# Patient Record
Sex: Female | Born: 1970 | Race: White | Hispanic: No | Marital: Married | Smoking: Former smoker
Health system: Southern US, Community
[De-identification: ages and names within clinical notes are randomized; demographics above are authoritative.]

## PROBLEM LIST (undated history)

## (undated) DIAGNOSIS — K5100 Ulcerative (chronic) pancolitis without complications: Secondary | ICD-10-CM

## (undated) DIAGNOSIS — N200 Calculus of kidney: Secondary | ICD-10-CM

## (undated) DIAGNOSIS — I10 Essential (primary) hypertension: Secondary | ICD-10-CM

## (undated) DIAGNOSIS — N201 Calculus of ureter: Secondary | ICD-10-CM

## (undated) DIAGNOSIS — B029 Zoster without complications: Secondary | ICD-10-CM

## (undated) DIAGNOSIS — L309 Dermatitis, unspecified: Secondary | ICD-10-CM

## (undated) DIAGNOSIS — Z85828 Personal history of other malignant neoplasm of skin: Secondary | ICD-10-CM

## (undated) DIAGNOSIS — Z9889 Other specified postprocedural states: Secondary | ICD-10-CM

## (undated) DIAGNOSIS — N281 Cyst of kidney, acquired: Secondary | ICD-10-CM

## (undated) DIAGNOSIS — R7989 Other specified abnormal findings of blood chemistry: Secondary | ICD-10-CM

## (undated) DIAGNOSIS — F419 Anxiety disorder, unspecified: Secondary | ICD-10-CM

## (undated) DIAGNOSIS — E876 Hypokalemia: Secondary | ICD-10-CM

## (undated) DIAGNOSIS — A0472 Enterocolitis due to Clostridium difficile, not specified as recurrent: Principal | ICD-10-CM

## (undated) DIAGNOSIS — C4491 Basal cell carcinoma of skin, unspecified: Secondary | ICD-10-CM

## (undated) DIAGNOSIS — T7840XA Allergy, unspecified, initial encounter: Secondary | ICD-10-CM

## (undated) HISTORY — DX: Other specified abnormal findings of blood chemistry: R79.89

## (undated) HISTORY — DX: Zoster without complications: B02.9

## (undated) HISTORY — DX: Allergy, unspecified, initial encounter: T78.40XA

## (undated) HISTORY — DX: Anxiety disorder, unspecified: F41.9

## (undated) HISTORY — DX: Basal cell carcinoma of skin, unspecified: C44.91

## (undated) HISTORY — DX: Ulcerative (chronic) pancolitis without complications: K51.00

## (undated) HISTORY — PX: EXTRACORPOREAL SHOCK WAVE LITHOTRIPSY: SHX1557

## (undated) HISTORY — DX: Hypokalemia: E87.6

## (undated) HISTORY — DX: Enterocolitis due to Clostridium difficile, not specified as recurrent: A04.72

---

## 2001-08-17 DIAGNOSIS — F419 Anxiety disorder, unspecified: Secondary | ICD-10-CM

## 2001-08-17 HISTORY — DX: Anxiety disorder, unspecified: F41.9

## 2006-10-26 ENCOUNTER — Other Ambulatory Visit: Admission: RE | Admit: 2006-10-26 | Discharge: 2006-10-26 | Payer: Self-pay | Admitting: *Deleted

## 2008-08-17 HISTORY — PX: MOHS SURGERY: SUR867

## 2009-04-17 ENCOUNTER — Ambulatory Visit: Payer: Self-pay | Admitting: Otolaryngology

## 2009-04-24 ENCOUNTER — Ambulatory Visit: Payer: Self-pay | Admitting: Otolaryngology

## 2009-04-25 ENCOUNTER — Ambulatory Visit: Payer: Self-pay | Admitting: Otolaryngology

## 2009-09-10 ENCOUNTER — Other Ambulatory Visit: Admission: RE | Admit: 2009-09-10 | Discharge: 2009-09-10 | Payer: Self-pay | Admitting: Family Medicine

## 2010-05-15 ENCOUNTER — Ambulatory Visit (HOSPITAL_COMMUNITY): Admission: RE | Admit: 2010-05-15 | Discharge: 2010-05-15 | Payer: Self-pay | Admitting: Urology

## 2010-07-03 ENCOUNTER — Ambulatory Visit (HOSPITAL_COMMUNITY): Admission: RE | Admit: 2010-07-03 | Discharge: 2010-07-03 | Payer: Self-pay | Admitting: Urology

## 2010-10-28 LAB — PREGNANCY, URINE: Preg Test, Ur: NEGATIVE

## 2010-10-30 LAB — COMPREHENSIVE METABOLIC PANEL
ALT: 32 U/L (ref 0–35)
AST: 31 U/L (ref 0–37)
Albumin: 4.2 g/dL (ref 3.5–5.2)
Alkaline Phosphatase: 78 U/L (ref 39–117)
BUN: 8 mg/dL (ref 6–23)
CO2: 25 mEq/L (ref 19–32)
Calcium: 9 mg/dL (ref 8.4–10.5)
Chloride: 108 mEq/L (ref 96–112)
Creatinine, Ser: 0.81 mg/dL (ref 0.4–1.2)
GFR calc Af Amer: >60 mL/min (ref >60)
GFR calc non Af Amer: >60 mL/min (ref >60)
Glucose, Bld: 100 mg/dL — ABNORMAL HIGH (ref 70–99)
Potassium: 3.5 mEq/L (ref 3.5–5.1)
Sodium: 140 mEq/L (ref 135–145)
Total Bilirubin: 1.5 mg/dL — ABNORMAL HIGH (ref 0.3–1.2)
Total Protein: 7.4 g/dL (ref 6.0–8.3)

## 2010-10-30 LAB — APTT: aPTT: 27 seconds (ref 24–37)

## 2010-10-30 LAB — CBC
HCT: 38.1 % (ref 36.0–46.0)
Hemoglobin: 13.1 g/dL (ref 12.0–15.0)
MCH: 29.6 pg (ref 26.0–34.0)
MCHC: 34.3 g/dL (ref 30.0–36.0)
MCV: 86.3 fL (ref 78.0–100.0)
Platelets: 286 10*3/uL (ref 150–400)
RBC: 4.42 MIL/uL (ref 3.87–5.11)
RDW: 13.4 % (ref 11.5–15.5)
WBC: 7.9 10*3/uL (ref 4.0–10.5)

## 2010-10-30 LAB — PTH, INTACT AND CALCIUM
Calcium, Total (PTH): 9.1 mg/dL (ref 8.4–10.5)
PTH: 59 pg/mL (ref 14.0–72.0)

## 2010-10-30 LAB — URIC ACID: Uric Acid, Serum: 7.6 mg/dL — ABNORMAL HIGH (ref 2.4–7.0)

## 2010-10-30 LAB — PROTIME-INR
INR: 0.94 (ref 0.00–1.49)
Prothrombin Time: 12.8 seconds (ref 11.6–15.2)

## 2010-10-30 LAB — PREGNANCY, URINE: Preg Test, Ur: NEGATIVE

## 2010-11-12 ENCOUNTER — Other Ambulatory Visit: Payer: Self-pay | Admitting: Family Medicine

## 2010-11-12 DIAGNOSIS — Z1231 Encounter for screening mammogram for malignant neoplasm of breast: Secondary | ICD-10-CM

## 2010-11-21 ENCOUNTER — Ambulatory Visit
Admission: RE | Admit: 2010-11-21 | Discharge: 2010-11-21 | Disposition: A | Discharge status: Home / Self Care | Payer: Federal, State, Local not specified - PPO | Source: Ambulatory Visit | Attending: Family Medicine | Admitting: Family Medicine

## 2010-11-21 DIAGNOSIS — Z1231 Encounter for screening mammogram for malignant neoplasm of breast: Secondary | ICD-10-CM

## 2011-06-19 ENCOUNTER — Other Ambulatory Visit: Payer: Self-pay | Admitting: Family Medicine

## 2011-06-19 DIAGNOSIS — Z1231 Encounter for screening mammogram for malignant neoplasm of breast: Secondary | ICD-10-CM

## 2011-07-22 ENCOUNTER — Ambulatory Visit
Admission: RE | Admit: 2011-07-22 | Discharge: 2011-07-22 | Disposition: A | Discharge status: Home / Self Care | Payer: Federal, State, Local not specified - PPO | Source: Ambulatory Visit | Attending: Family Medicine | Admitting: Family Medicine

## 2011-07-22 DIAGNOSIS — Z1231 Encounter for screening mammogram for malignant neoplasm of breast: Secondary | ICD-10-CM

## 2012-08-15 ENCOUNTER — Other Ambulatory Visit: Payer: Self-pay | Admitting: Family Medicine

## 2012-08-15 DIAGNOSIS — Z1231 Encounter for screening mammogram for malignant neoplasm of breast: Secondary | ICD-10-CM

## 2012-09-20 ENCOUNTER — Ambulatory Visit
Admission: RE | Admit: 2012-09-20 | Discharge: 2012-09-20 | Disposition: A | Discharge status: Home / Self Care | Payer: Federal, State, Local not specified - PPO | Source: Ambulatory Visit | Attending: Family Medicine | Admitting: Family Medicine

## 2012-09-20 DIAGNOSIS — Z1231 Encounter for screening mammogram for malignant neoplasm of breast: Secondary | ICD-10-CM

## 2013-04-19 ENCOUNTER — Other Ambulatory Visit (HOSPITAL_COMMUNITY)
Admission: RE | Admit: 2013-04-19 | Discharge: 2013-04-19 | Disposition: A | Discharge status: Home / Self Care | Payer: Federal, State, Local not specified - PPO | Source: Ambulatory Visit | Attending: Family Medicine | Admitting: Family Medicine

## 2013-04-19 ENCOUNTER — Other Ambulatory Visit: Payer: Self-pay

## 2013-04-19 DIAGNOSIS — Z0000 Encounter for general adult medical examination without abnormal findings: Secondary | ICD-10-CM | POA: Insufficient documentation

## 2013-10-31 ENCOUNTER — Other Ambulatory Visit: Payer: Self-pay

## 2013-10-31 DIAGNOSIS — Z1231 Encounter for screening mammogram for malignant neoplasm of breast: Secondary | ICD-10-CM

## 2013-11-17 ENCOUNTER — Ambulatory Visit: Payer: Federal, State, Local not specified - PPO

## 2013-12-05 ENCOUNTER — Encounter (INDEPENDENT_AMBULATORY_CARE_PROVIDER_SITE_OTHER): Payer: Self-pay

## 2013-12-05 ENCOUNTER — Ambulatory Visit
Admission: RE | Admit: 2013-12-05 | Discharge: 2013-12-05 | Disposition: A | Discharge status: Home / Self Care | Payer: Self-pay | Source: Ambulatory Visit

## 2013-12-05 DIAGNOSIS — Z1231 Encounter for screening mammogram for malignant neoplasm of breast: Secondary | ICD-10-CM

## 2013-12-07 ENCOUNTER — Other Ambulatory Visit: Payer: Self-pay | Admitting: Family Medicine

## 2013-12-07 DIAGNOSIS — R928 Other abnormal and inconclusive findings on diagnostic imaging of breast: Secondary | ICD-10-CM

## 2013-12-15 ENCOUNTER — Ambulatory Visit
Admission: RE | Admit: 2013-12-15 | Discharge: 2013-12-15 | Disposition: A | Discharge status: Home / Self Care | Payer: Federal, State, Local not specified - PPO | Source: Ambulatory Visit | Attending: Family Medicine | Admitting: Family Medicine

## 2013-12-15 DIAGNOSIS — R928 Other abnormal and inconclusive findings on diagnostic imaging of breast: Secondary | ICD-10-CM

## 2014-09-21 ENCOUNTER — Other Ambulatory Visit: Payer: Self-pay | Admitting: Urology

## 2014-09-21 ENCOUNTER — Emergency Department (HOSPITAL_COMMUNITY): Payer: Federal, State, Local not specified - PPO

## 2014-09-21 ENCOUNTER — Encounter (HOSPITAL_COMMUNITY): Payer: Self-pay | Admitting: Emergency Medicine

## 2014-09-21 ENCOUNTER — Emergency Department (HOSPITAL_COMMUNITY)
Admission: EM | Admit: 2014-09-21 | Discharge: 2014-09-21 | Disposition: A | Discharge status: Home / Self Care | Payer: Federal, State, Local not specified - PPO | Attending: Emergency Medicine | Admitting: Emergency Medicine

## 2014-09-21 DIAGNOSIS — I10 Essential (primary) hypertension: Secondary | ICD-10-CM | POA: Insufficient documentation

## 2014-09-21 DIAGNOSIS — N201 Calculus of ureter: Secondary | ICD-10-CM | POA: Insufficient documentation

## 2014-09-21 DIAGNOSIS — R319 Hematuria, unspecified: Secondary | ICD-10-CM | POA: Diagnosis not present

## 2014-09-21 DIAGNOSIS — Z3202 Encounter for pregnancy test, result negative: Secondary | ICD-10-CM | POA: Insufficient documentation

## 2014-09-21 DIAGNOSIS — R1031 Right lower quadrant pain: Secondary | ICD-10-CM | POA: Diagnosis present

## 2014-09-21 DIAGNOSIS — N23 Unspecified renal colic: Secondary | ICD-10-CM

## 2014-09-21 HISTORY — DX: Essential (primary) hypertension: I10

## 2014-09-21 LAB — CBC WITH DIFFERENTIAL/PLATELET
Basophils Absolute: 0.1 10*3/uL (ref 0.0–0.1)
Basophils Relative: 0 % (ref 0–1)
Eosinophils Absolute: 0.2 10*3/uL (ref 0.0–0.7)
Eosinophils Relative: 2 % (ref 0–5)
HCT: 38.2 % (ref 36.0–46.0)
Hemoglobin: 13.1 g/dL (ref 12.0–15.0)
Lymphocytes Relative: 18 % (ref 12–46)
Lymphs Abs: 2.5 10*3/uL (ref 0.7–4.0)
MCH: 28.7 pg (ref 26.0–34.0)
MCHC: 34.3 g/dL (ref 30.0–36.0)
MCV: 83.6 fL (ref 78.0–100.0)
Monocytes Absolute: 1.1 10*3/uL — ABNORMAL HIGH (ref 0.1–1.0)
Monocytes Relative: 8 % (ref 3–12)
Neutro Abs: 9.8 10*3/uL — ABNORMAL HIGH (ref 1.7–7.7)
Neutrophils Relative %: 72 % (ref 43–77)
Platelets: 267 10*3/uL (ref 150–400)
RBC: 4.57 MIL/uL (ref 3.87–5.11)
RDW: 12.9 % (ref 11.5–15.5)
WBC: 13.7 10*3/uL — ABNORMAL HIGH (ref 4.0–10.5)

## 2014-09-21 LAB — URINALYSIS, ROUTINE W REFLEX MICROSCOPIC
Bilirubin Urine: NEGATIVE
Glucose, UA: NEGATIVE mg/dL
Ketones, ur: NEGATIVE mg/dL
Nitrite: NEGATIVE
Protein, ur: 30 mg/dL — AB
Specific Gravity, Urine: 1.018 (ref 1.005–1.030)
Urobilinogen, UA: 0.2 mg/dL (ref 0.0–1.0)
pH: 6 (ref 5.0–8.0)

## 2014-09-21 LAB — BASIC METABOLIC PANEL
Anion gap: 8 (ref 5–15)
BUN: 16 mg/dL (ref 6–23)
CO2: 25 mmol/L (ref 19–32)
Calcium: 9.1 mg/dL (ref 8.4–10.5)
Chloride: 104 mmol/L (ref 96–112)
Creatinine, Ser: 1.03 mg/dL (ref 0.50–1.10)
GFR calc Af Amer: 75 mL/min — ABNORMAL LOW (ref >90)
GFR calc non Af Amer: 65 mL/min — ABNORMAL LOW (ref >90)
Glucose, Bld: 140 mg/dL — ABNORMAL HIGH (ref 70–99)
Potassium: 3.3 mmol/L — ABNORMAL LOW (ref 3.5–5.1)
Sodium: 137 mmol/L (ref 135–145)

## 2014-09-21 LAB — URINE MICROSCOPIC-ADD ON

## 2014-09-21 LAB — POC URINE PREG, ED: Preg Test, Ur: NEGATIVE

## 2014-09-21 MED ORDER — TAMSULOSIN HCL 0.4 MG PO CAPS: 0.4000 mg | ORAL_CAPSULE | Freq: Every day | ORAL | Status: DC

## 2014-09-21 MED ORDER — SULFAMETHOXAZOLE-TRIMETHOPRIM 800-160 MG PO TABS: 1.0000 | ORAL_TABLET | Freq: Two times a day (BID) | ORAL | Status: DC

## 2014-09-21 MED ORDER — OXYCODONE-ACETAMINOPHEN 5-325 MG PO TABS: 2.0000 | ORAL_TABLET | ORAL | Status: DC | PRN

## 2014-09-21 MED ORDER — ONDANSETRON 4 MG PO TBDP: 8.0000 mg | ORAL_TABLET | Freq: Once | ORAL | Status: DC

## 2014-09-21 MED ORDER — ONDANSETRON HCL 4 MG/2ML IJ SOLN
4.0000 mg | Freq: Once | INTRAMUSCULAR | Status: AC
  Administered 2014-09-21: 4 mg via INTRAVENOUS
  Filled 2014-09-21: qty 2

## 2014-09-21 MED ORDER — MORPHINE SULFATE 4 MG/ML IJ SOLN
4.0000 mg | Freq: Once | INTRAMUSCULAR | Status: AC
Start: 2014-09-21 — End: 2014-09-21
  Administered 2014-09-21: 4 mg via INTRAVENOUS
  Filled 2014-09-21: qty 1

## 2014-09-21 NOTE — ED Notes (Signed)
Pt. reports RLQ pain with nausea , vomitting , diarrhea and chills.

## 2014-09-21 NOTE — ED Notes (Signed)
Patient transported to CT 

## 2014-09-21 NOTE — ED Provider Notes (Signed)
CSN: [id number]     Arrival date & time 09/21/14  0137 History  This chart was scribed for Sharyon Cable, MD by Molli Posey, ED Scribe. This patient was seen in room D36C/D36C and the patient's care was started 2:06 AM.     Chief Complaint  Patient presents with  . Emesis  . Abdominal Pain   Patient is a 44 y.o. female presenting with abdominal pain. The history is provided by the patient. No language interpreter was used.  Abdominal Pain Pain location:  RLQ Pain quality: cramping   Pain radiates to:  Does not radiate Pain severity:  Unable to specify Onset quality:  Sudden Duration:  8 days Timing:  Constant Progression:  Unable to specify Relieved by:  Nothing Associated symptoms: chills, diarrhea, nausea and vomiting   Associated symptoms: no cough, no dysuria, no fever, no vaginal bleeding and no vaginal discharge    HPI Comments: Elizabeth Huerta is a 44 y.o. female with a history of HTN who presents to the Emergency Department complaining of constant RLQ pain that started at [facility] last night. She describes her abdominal pain as cramping and sharp. Pt reports associated 2 episodes of diarrhea that started around 9PM last night and vomiting around 12:30AM this morning. She states that she has chills as well. Pt reports no similar prior past episodes. She states that she has been taking Naproxen twice a day for the last 10 days for her elbow. She reports no alleviating or modifying factors at this time. Pt reports no known sick contacts. She denies a history of any recent surgeries. She denies blood in her stool, fever, dysuria, vaginal bleeding or discharge and cough. Pt reports NKDA.   Past Medical History  Diagnosis Date  . Hypertension    History reviewed. No pertinent past surgical history. No family history on file. History  Substance Use Topics  . Smoking status: Never Smoker   . Smokeless tobacco: Not on file  . Alcohol Use: Yes   OB History    No data available      Review of Systems  Constitutional: Positive for chills. Negative for fever.  Respiratory: Negative for cough.   Gastrointestinal: Positive for nausea, vomiting, abdominal pain and diarrhea.  Genitourinary: Negative for dysuria, vaginal bleeding and vaginal discharge.  All other systems reviewed and are negative.   Allergies  Review of patient's allergies indicates no known allergies.  Home Medications   Prior to Admission medications   Not on File   BP 113/71 mmHg  Pulse 67  Temp(Src) 98.1 F (36.7 C) (Oral)  Resp 14  Ht 5\' 4"  (1.626 m)  Wt 190 lb (86.183 kg)  BMI 32.60 kg/m2  SpO2 98%  LMP 09/07/2014   Physical Exam CONSTITUTIONAL: Well developed/well nourished HEAD: Normocephalic/atraumatic EYES: EOMI/PERRL ENMT: Mucous membranes moist NECK: supple no meningeal signs SPINE/BACK:entire spine nontender CV: S1/S2 noted, no murmurs/rubs/gallops noted LUNGS: Lungs are clear to auscultation bilaterally, no apparent distress ABDOMEN: soft, nontender, no rebound or guarding, bowel sounds noted throughout abdomen GU:no cva tenderness NEURO: Pt is awake/alert/appropriate, moves all extremitiesx4.  No facial droop.   EXTREMITIES: pulses normal/equal, full ROM SKIN: warm, color normal PSYCH: no abnormalities of mood noted, alert and oriented to situation  ED Course  Procedures   DIAGNOSTIC STUDIES: Oxygen Saturation is 98% on RA, normal by my interpretation.    COORDINATION OF CARE: 2:12 AM Discussed treatment plan with pt at bedside and pt agreed to plan.  4:08 AM On repeat  exam pt does have mild RLQ tenderness.  She has hematuria - she reports distant h/o nephrolithiasis that required lithotripsy.  She was unaware of hematuria.  This could represent ureterolithiasis.  Ct imaging ordered at patient request 5:21 AM CT shows large ureteral stone Pt is afebrile Her pain is controlled She is not vomiting Urine culture ordered Will start percocet, bactrim and  flomax She will call her urologist today We discussed strict return precautions BP 116/67 mmHg  Pulse 68  Temp(Src) 98.1 F (36.7 C) (Oral)  Resp 14  Ht 5\' 4"  (1.626 m)  Wt 190 lb (86.183 kg)  BMI 32.60 kg/m2  SpO2 95%  LMP 09/07/2014  Labs Review Labs Reviewed  URINALYSIS, ROUTINE W REFLEX MICROSCOPIC - Abnormal; Notable for the following:    APPearance CLOUDY (*)    Hgb urine dipstick LARGE (*)    Protein, ur 30 (*)    Leukocytes, UA MODERATE (*)    All other components within normal limits  BASIC METABOLIC PANEL - Abnormal; Notable for the following:    Potassium 3.3 (*)    Glucose, Bld 140 (*)    GFR calc non Af Amer 65 (*)    GFR calc Af Amer 75 (*)    All other components within normal limits  CBC WITH DIFFERENTIAL/PLATELET - Abnormal; Notable for the following:    WBC 13.7 (*)    Neutro Abs 9.8 (*)    Monocytes Absolute 1.1 (*)    All other components within normal limits  URINE MICROSCOPIC-ADD ON - Abnormal; Notable for the following:    Squamous Epithelial / LPF FEW (*)    Bacteria, UA FEW (*)    All other components within normal limits  POC URINE PREG, ED    Imaging Review Ct Abdomen Pelvis Wo Contrast  09/21/2014   CLINICAL DATA:  Hematuria. Right lower quadrant abdominal pain. Nausea and vomiting.  EXAM: CT ABDOMEN AND PELVIS WITHOUT CONTRAST  TECHNIQUE: Multidetector CT imaging of the abdomen and pelvis was performed following the standard protocol without IV contrast.  COMPARISON:  CT 05/15/2010  FINDINGS: The included lung bases are clear, mild dependent atelectasis.  There is an obstructing 13 x 10 mm stone in the right mid ureter with resultant marked hydroureteronephrosis. There is moderate perinephric and periureteric stranding. Ureter distal to this is decompressed. No additional nonobstructing right stones. There is a right renal cyst in the interpolar region. Punctate nonobstructing stone in the upper left kidney, no left hydroureteronephrosis. Left  ureter is decompressed. Urinary bladder is minimally distended without stone.  Diminished hepatic steatosis from prior. Gallbladder is physiologically distended. The unenhanced spleen, pancreas, and adrenal glands are normal. There are no dilated or thickened bowel loops. The appendix is normal. No free air, free fluid, or intra-abdominal fluid collection. The abdominal aorta is normal in caliber. Small retroperitoneal soft tissue densities likely small lymph nodes and reactive.  Within the pelvis the uterus is normal for age. There is a 2.2 cm left ovarian cyst. No significant pelvic free fluid. No pelvic adenopathy.  No acute osseous abnormality.  IMPRESSION: 1. Obstructing 13 x 10 mm stone in the right mid ureter with moderate to marked resultant hydroureteronephrosis and perinephric stranding. 2. Punctate nonobstructing stone in the left kidney.   Electronically Signed   By: Jeb Levering M.D.   On: 09/21/2014 04:41    Medications  ondansetron (ZOFRAN) injection 4 mg (4 mg Intravenous Given 09/21/14 0233)  morphine 4 MG/ML injection 4 mg (4 mg Intravenous  Given 09/21/14 0235)     MDM   Final diagnoses:  Hematuria  Right ureteral stone  Ureteral colic    Nursing notes including past medical history and social history reviewed and considered in documentation Labs/vital reviewed myself and considered during evaluation  I personally performed the services described in this documentation, which was scribed in my presence. The recorded information has been reviewed and is accurate.        Sharyon Cable, MD 09/21/14 Delrae Rend

## 2014-09-22 LAB — URINE CULTURE: Colony Count: 15000

## 2014-09-25 ENCOUNTER — Encounter (HOSPITAL_BASED_OUTPATIENT_CLINIC_OR_DEPARTMENT_OTHER): Payer: Self-pay | Admitting: *Deleted

## 2014-09-25 NOTE — Progress Notes (Signed)
NPO AFTER MN. ARRIVE AT 0600. NEEDS EKG. CURRENT LAB RESULTS IN CHART AND EPIC. MAY TAKE PAIN / NAUSEA AM DOS W/ SIPS OF WATER.

## 2014-09-28 ENCOUNTER — Ambulatory Visit (HOSPITAL_BASED_OUTPATIENT_CLINIC_OR_DEPARTMENT_OTHER): Payer: Federal, State, Local not specified - PPO | Admitting: Anesthesiology

## 2014-09-28 ENCOUNTER — Encounter (HOSPITAL_BASED_OUTPATIENT_CLINIC_OR_DEPARTMENT_OTHER)
Admission: RE | Disposition: A | Discharge status: Home / Self Care | Payer: Self-pay | Source: Ambulatory Visit | Attending: Urology

## 2014-09-28 ENCOUNTER — Encounter (HOSPITAL_BASED_OUTPATIENT_CLINIC_OR_DEPARTMENT_OTHER): Payer: Self-pay | Admitting: *Deleted

## 2014-09-28 ENCOUNTER — Ambulatory Visit (HOSPITAL_BASED_OUTPATIENT_CLINIC_OR_DEPARTMENT_OTHER)
Admission: RE | Admit: 2014-09-28 | Discharge: 2014-09-28 | Disposition: A | Discharge status: Home / Self Care | Payer: Federal, State, Local not specified - PPO | Source: Ambulatory Visit | Attending: Urology | Admitting: Urology

## 2014-09-28 DIAGNOSIS — Z85828 Personal history of other malignant neoplasm of skin: Secondary | ICD-10-CM | POA: Diagnosis not present

## 2014-09-28 DIAGNOSIS — N132 Hydronephrosis with renal and ureteral calculous obstruction: Secondary | ICD-10-CM | POA: Diagnosis not present

## 2014-09-28 DIAGNOSIS — N202 Calculus of kidney with calculus of ureter: Secondary | ICD-10-CM | POA: Diagnosis present

## 2014-09-28 DIAGNOSIS — I10 Essential (primary) hypertension: Secondary | ICD-10-CM | POA: Insufficient documentation

## 2014-09-28 HISTORY — PX: CYSTOSCOPY/RETROGRADE/URETEROSCOPY: SHX5316

## 2014-09-28 HISTORY — DX: Other specified postprocedural states: Z98.890

## 2014-09-28 HISTORY — DX: Calculus of kidney: N20.0

## 2014-09-28 HISTORY — DX: Cyst of kidney, acquired: N28.1

## 2014-09-28 HISTORY — DX: Other specified postprocedural states: Z85.828

## 2014-09-28 HISTORY — DX: Dermatitis, unspecified: L30.9

## 2014-09-28 HISTORY — PX: HOLMIUM LASER APPLICATION: SHX5852

## 2014-09-28 HISTORY — DX: Calculus of ureter: N20.1

## 2014-09-28 LAB — POCT PREGNANCY, URINE: Preg Test, Ur: NEGATIVE

## 2014-09-28 SURGERY — CYSTOSCOPY/RETROGRADE/URETEROSCOPY
Anesthesia: General | Site: Ureter | Laterality: Right

## 2014-09-28 MED ORDER — MEPERIDINE HCL 25 MG/ML IJ SOLN
6.2500 mg | INTRAMUSCULAR | Status: DC | PRN
  Filled 2014-09-28: qty 1

## 2014-09-28 MED ORDER — LACTATED RINGERS IV SOLN
INTRAVENOUS | Status: DC
  Administered 2014-09-28: 06:00:00 via INTRAVENOUS
  Filled 2014-09-28: qty 1000

## 2014-09-28 MED ORDER — MIDAZOLAM HCL 5 MG/5ML IJ SOLN
INTRAMUSCULAR | Status: DC | PRN
  Administered 2014-09-28: 2 mg via INTRAVENOUS

## 2014-09-28 MED ORDER — MIDAZOLAM HCL 2 MG/2ML IJ SOLN
INTRAMUSCULAR | Status: AC
  Filled 2014-09-28: qty 2

## 2014-09-28 MED ORDER — ACETAMINOPHEN 10 MG/ML IV SOLN
INTRAVENOUS | Status: DC | PRN
  Administered 2014-09-28: 1000 mg via INTRAVENOUS

## 2014-09-28 MED ORDER — KETOROLAC TROMETHAMINE 30 MG/ML IJ SOLN
INTRAMUSCULAR | Status: DC | PRN
  Administered 2014-09-28: 30 mg via INTRAVENOUS

## 2014-09-28 MED ORDER — ONDANSETRON HCL 4 MG/2ML IJ SOLN
INTRAMUSCULAR | Status: DC | PRN
  Administered 2014-09-28: 4 mg via INTRAVENOUS

## 2014-09-28 MED ORDER — PROMETHAZINE HCL 25 MG/ML IJ SOLN
6.2500 mg | INTRAMUSCULAR | Status: DC | PRN
  Filled 2014-09-28: qty 1

## 2014-09-28 MED ORDER — DEXAMETHASONE SODIUM PHOSPHATE 4 MG/ML IJ SOLN
INTRAMUSCULAR | Status: DC | PRN
  Administered 2014-09-28: 10 mg via INTRAVENOUS

## 2014-09-28 MED ORDER — OXYCODONE-ACETAMINOPHEN 5-325 MG PO TABS: 1.0000 | ORAL_TABLET | Freq: Four times a day (QID) | ORAL | Status: DC | PRN

## 2014-09-28 MED ORDER — FENTANYL CITRATE 0.05 MG/ML IJ SOLN
INTRAMUSCULAR | Status: DC | PRN
  Administered 2014-09-28 (×2): 50 ug via INTRAVENOUS

## 2014-09-28 MED ORDER — FENTANYL CITRATE 0.05 MG/ML IJ SOLN
25.0000 ug | INTRAMUSCULAR | Status: DC | PRN
  Filled 2014-09-28: qty 1

## 2014-09-28 MED ORDER — LACTATED RINGERS IV SOLN
INTRAVENOUS | Status: DC
  Filled 2014-09-28: qty 1000

## 2014-09-28 MED ORDER — PROPOFOL 10 MG/ML IV BOLUS
INTRAVENOUS | Status: DC | PRN
  Administered 2014-09-28: 200 mg via INTRAVENOUS

## 2014-09-28 MED ORDER — FENTANYL CITRATE 0.05 MG/ML IJ SOLN
INTRAMUSCULAR | Status: AC
  Filled 2014-09-28: qty 4

## 2014-09-28 MED ORDER — GENTAMICIN SULFATE 40 MG/ML IJ SOLN
130.6500 mg | INTRAVENOUS | Status: DC | PRN
  Administered 2014-09-28: 340 mg via INTRAVENOUS

## 2014-09-28 MED ORDER — SODIUM CHLORIDE 0.9 % IR SOLN
Status: DC | PRN
  Administered 2014-09-28: 4000 mL

## 2014-09-28 MED ORDER — GENTAMICIN IN SALINE 1.6-0.9 MG/ML-% IV SOLN
80.0000 mg | INTRAVENOUS | Status: DC
  Filled 2014-09-28: qty 50

## 2014-09-28 MED ORDER — SULFAMETHOXAZOLE-TRIMETHOPRIM 800-160 MG PO TABS
1.0000 | ORAL_TABLET | Freq: Two times a day (BID) | ORAL | Status: DC
Start: 2014-09-28 — End: 2015-06-20

## 2014-09-28 MED ORDER — IOHEXOL 350 MG/ML SOLN
INTRAVENOUS | Status: DC | PRN
Start: 2014-09-28 — End: 2014-09-28
  Administered 2014-09-28: 17 mL

## 2014-09-28 MED ORDER — STERILE WATER FOR IRRIGATION IR SOLN
Status: DC | PRN
  Administered 2014-09-28: 500 mL

## 2014-09-28 MED ORDER — SENNOSIDES-DOCUSATE SODIUM 8.6-50 MG PO TABS: 1.0000 | ORAL_TABLET | Freq: Two times a day (BID) | ORAL | Status: DC

## 2014-09-28 MED ORDER — LIDOCAINE HCL (CARDIAC) 20 MG/ML IV SOLN
INTRAVENOUS | Status: DC | PRN
  Administered 2014-09-28: 80 mg via INTRAVENOUS

## 2014-09-28 SURGICAL SUPPLY — 48 items
BAG DRAIN URO-CYSTO SKYTR STRL (DRAIN) ×2 IMPLANT
BAG DRN UROCATH (DRAIN) ×1
BAG URO CATCHER STRL LF (DRAPE) ×2 IMPLANT
BASKET LASER NITINOL 1.9FR (BASKET) ×2 IMPLANT
BASKET STNLS GEMINI 4WIRE 3FR (BASKET) IMPLANT
BASKET ZERO TIP NITINOL 2.4FR (BASKET) IMPLANT
BSKT STON RTRVL 120 1.9FR (BASKET) ×1
BSKT STON RTRVL GEM 120X11 3FR (BASKET)
BSKT STON RTRVL ZERO TP 2.4FR (BASKET)
CANISTER SUCT LVC 12 LTR MEDI- (MISCELLANEOUS) ×2 IMPLANT
CATH INTERMIT  6FR 70CM (CATHETERS) ×2 IMPLANT
CATH URET 5FR 28IN CONE TIP (BALLOONS)
CATH URET 5FR 28IN OPEN ENDED (CATHETERS) ×1 IMPLANT
CATH URET 5FR 70CM CONE TIP (BALLOONS) IMPLANT
CLOTH BEACON ORANGE TIMEOUT ST (SAFETY) ×2 IMPLANT
DRAPE CAMERA CLOSED 9X96 (DRAPES) ×2 IMPLANT
ELECT REM PT RETURN 9FT ADLT (ELECTROSURGICAL)
ELECTRODE REM PT RTRN 9FT ADLT (ELECTROSURGICAL) IMPLANT
FIBER LASER FLEXIVA 200 (UROLOGICAL SUPPLIES) IMPLANT
FIBER LASER FLEXIVA 365 (UROLOGICAL SUPPLIES) IMPLANT
FIBER LASER TRAC TIP (UROLOGICAL SUPPLIES) ×1 IMPLANT
GLOVE BIO SURGEON STRL SZ7.5 (GLOVE) ×1 IMPLANT
GLOVE ECLIPSE 6.5 STRL STRAW (GLOVE) ×2 IMPLANT
GLOVE OPTIFIT SS 6.5 STRL BRWN (GLOVE) ×1 IMPLANT
GLOVE SURG SS PI 7.5 STRL IVOR (GLOVE) ×1 IMPLANT
GOWN PREVENTION PLUS LG XLONG (DISPOSABLE) ×1 IMPLANT
GOWN PREVENTION PLUS XLARGE (GOWN DISPOSABLE) ×1 IMPLANT
GOWN STRL NON-REIN LRG LVL3 (GOWN DISPOSABLE) ×2 IMPLANT
GOWN STRL REIN XL XLG (GOWN DISPOSABLE) ×1 IMPLANT
GOWN STRL REUS W/ TWL LRG LVL3 (GOWN DISPOSABLE) IMPLANT
GOWN STRL REUS W/ TWL XL LVL3 (GOWN DISPOSABLE) IMPLANT
GOWN STRL REUS W/TWL LRG LVL3 (GOWN DISPOSABLE) ×2
GOWN STRL REUS W/TWL XL LVL3 (GOWN DISPOSABLE) ×2
GUIDEWIRE 0.038 PTFE COATED (WIRE) IMPLANT
GUIDEWIRE ANG ZIPWIRE 038X150 (WIRE) ×2 IMPLANT
GUIDEWIRE STR DUAL SENSOR (WIRE) ×2 IMPLANT
IV NS 1000ML (IV SOLUTION) ×2
IV NS 1000ML BAXH (IV SOLUTION) IMPLANT
IV NS IRRIG 3000ML ARTHROMATIC (IV SOLUTION) ×4 IMPLANT
KIT BALLIN UROMAX 15FX10 (LABEL) IMPLANT
KIT BALLN UROMAX 15FX4 (MISCELLANEOUS) IMPLANT
KIT BALLN UROMAX 26 75X4 (MISCELLANEOUS)
PACK CYSTO (CUSTOM PROCEDURE TRAY) ×2 IMPLANT
SET HIGH PRES BAL DIL (LABEL)
STENT POLARIS 5FRX24 (STENTS) ×1 IMPLANT
SYRINGE 10CC LL (SYRINGE) ×2 IMPLANT
SYRINGE IRR TOOMEY STRL 70CC (SYRINGE) IMPLANT
TUBE FEEDING 8FR 16IN STR KANG (MISCELLANEOUS) ×2 IMPLANT

## 2014-09-28 NOTE — Anesthesia Procedure Notes (Signed)
Procedure Name: LMA Insertion Date/Time: 09/28/2014 7:59 AM Performed by: Mechele Claude Pre-anesthesia Checklist: Patient identified, Emergency Drugs available, Suction available and Patient being monitored Patient Re-evaluated:Patient Re-evaluated prior to inductionOxygen Delivery Method: Circle System Utilized Preoxygenation: Pre-oxygenation with 100% oxygen Intubation Type: IV induction Ventilation: Mask ventilation without difficulty LMA: LMA inserted LMA Size: 4.0 Number of attempts: 1 Airway Equipment and Method: bite block Placement Confirmation: positive ETCO2 Tube secured with: Tape Dental Injury: Teeth and Oropharynx as per pre-operative assessment

## 2014-09-28 NOTE — Anesthesia Postprocedure Evaluation (Signed)
  Anesthesia Post-op Note  Patient: Elizabeth Huerta  Procedure(s) Performed: Procedure(s) (LRB): CYSTOSCOPY RIGHT RETROGRADE RIGHT URETEROSCOPY (Right) HOLMIUM LASER OF STONES  (Right)  Patient Location: PACU  Anesthesia Type: General  Level of Consciousness: awake and alert   Airway and Oxygen Therapy: Patient Spontanous Breathing  Post-op Pain: mild  Post-op Assessment: Post-op Vital signs reviewed, Patient's Cardiovascular Status Stable, Respiratory Function Stable, Patent Airway and No signs of Nausea or vomiting  Last Vitals:  Filed Vitals:   09/28/14 1000  BP: 125/79  Pulse: 70  Temp: 36.4 C  Resp: 16    Post-op Vital Signs: stable   Complications: No apparent anesthesia complications

## 2014-09-28 NOTE — Anesthesia Preprocedure Evaluation (Addendum)
Anesthesia Evaluation  Patient identified by MRN, date of birth, ID band Patient awake    Reviewed: Allergy & Precautions, NPO status , Patient's Chart, lab work & pertinent test results  Airway Mallampati: II  TM Distance: >3 FB Neck ROM: Full    Dental no notable dental hx. (+)    Pulmonary neg pulmonary ROS,  breath sounds clear to auscultation  Pulmonary exam normal       Cardiovascular hypertension, Pt. on medications Rhythm:Regular Rate:Normal     Neuro/Psych negative neurological ROS  negative psych ROS   GI/Hepatic negative GI ROS, Neg liver ROS,   Endo/Other  negative endocrine ROS  Renal/GU negative Renal ROS  negative genitourinary   Musculoskeletal negative musculoskeletal ROS (+)   Abdominal   Peds negative pediatric ROS (+)  Hematology negative hematology ROS (+)   Anesthesia Other Findings   Reproductive/Obstetrics negative OB ROS                            Anesthesia Physical Anesthesia Plan  ASA: II  Anesthesia Plan: General   Post-op Pain Management:    Induction: Intravenous  Airway Management Planned: LMA  Additional Equipment:   Intra-op Plan:   Post-operative Plan: Extubation in OR  Informed Consent: I have reviewed the patients History and Physical, chart, labs and discussed the procedure including the risks, benefits and alternatives for the proposed anesthesia with the patient or authorized representative who has indicated his/her understanding and acceptance.   Dental advisory given  Plan Discussed with: CRNA  Anesthesia Plan Comments:         Anesthesia Quick Evaluation

## 2014-09-28 NOTE — Discharge Instructions (Signed)
1 - You may have urinary urgency (bladder spasms) and bloody urine on / off with stent in place. This is normal.  2 - Call MD or go to ER for fever >102, severe pain / nausea / vomiting not relieved by medications, or acute change in medical status  Post Anesthesia Home Care Instructions  Activity: Get plenty of rest for the remainder of the day. A responsible adult should stay with you for 24 hours following the procedure.  For the next 24 hours, DO NOT: -Drive a car -Paediatric nurse -Drink alcoholic beverages -Take any medication unless instructed by your physician -Make any legal decisions or sign important papers.  Meals: Start with liquid foods such as gelatin or soup. Progress to regular foods as tolerated. Avoid greasy, spicy, heavy foods. If nausea and/or vomiting occur, drink only clear liquids until the nausea and/or vomiting subsides. Call your physician if vomiting continues.  Special Instructions/Symptoms: Your throat may feel dry or sore from the anesthesia or the breathing tube placed in your throat during surgery. If this causes discomfort, gargle with warm salt water. The discomfort should disappear within 24 hours. [facility] [phone] Post Ureteroscopy With or Without Stent Instructions  Definitions:  Ureter: The duct that transports urine from the kidney to the bladder. Stent:   A plastic hollow tube that is placed into the ureter, from the kidney to the                 bladder to prevent the ureter from swelling shut.  GENERAL INSTRUCTIONS:  Despite the fact that no skin incisions were used, the area around the ureter and bladder is raw and irritated. The stent is a foreign body which will further irritate the bladder wall. This irritation is manifested by increased frequency of urination, both day and night, and by an increase in the urge to urinate. In some, the urge to urinate is present almost always. Sometimes the urge is strong enough  that you may not be able to stop yourself from urinating. The only real cure is to remove the stent and then give time for the bladder wall to heal which can't be done until the danger of the ureter swelling shut has passed, which varies.  You may see some blood in your urine while the stent is in place and a few days afterwards. Do not be alarmed, even if the urine was clear for a while. Get off your feet and drink lots of fluids until clearing occurs. If you start to pass clots or don't improve, call us.  DIET: You may return to your normal diet immediately. Because of the raw surface of your bladder, alcohol, spicy foods, acid type foods and drinks with caffeine may cause irritation or frequency and should be used in moderation. To keep your urine flowing freely and to avoid constipation, drink plenty of fluids during the day ( 8-10 glasses ). Tip: Avoid cranberry juice because it is very acidic.  ACTIVITY: Your physical activity doesn't need to be restricted. However, if you are very active, you may see some blood in your urine. We suggest that you reduce your activity under these circumstances until the bleeding has stopped.  BOWELS: It is important to keep your bowels regular during the postoperative period. Straining with bowel movements can cause bleeding. A bowel movement every other day is reasonable. Use a mild laxative if needed, such as Milk of Magnesia 2-3 tablespoons, or 2 Dulcolax tablets. Call if you continue to  have problems. If you have been taking narcotics for pain, before, during or after your surgery, you may be constipated. Take a laxative if necessary.   MEDICATION: You should resume your pre-surgery medications unless told not to. In addition you will often be given an antibiotic to prevent infection. These should be taken as prescribed until the bottles are finished unless you are having an unusual reaction to one of the drugs.  PROBLEMS YOU SHOULD REPORT TO [place]:  Fevers  over 100.5 Fahrenheit.  Heavy bleeding, or clots ( See above notes about blood in urine ).  Inability to urinate.  Drug reactions ( hives, rash, nausea, vomiting, diarrhea ).  Severe burning or pain with urination that is not improving.  FOLLOW-UP: You will need a follow-up appointment to monitor your progress. Call for this appointment at the number listed above. Usually the first appointment will be about three to fourteen days after your surgery.

## 2014-09-28 NOTE — Brief Op Note (Signed)
09/28/2014  8:44 AM  PATIENT:  Elizabeth Huerta  44 y.o. female  PRE-OPERATIVE DIAGNOSIS:  RIGHT URETERAL STONE   POST-OPERATIVE DIAGNOSIS:  RIGHT URETERAL STONE   PROCEDURE:  Procedure(s): CYSTOSCOPY RIGHT RETROGRADE RIGHT URETEROSCOPY (Right) HOLMIUM Huerta OF STONES  (Right)  SURGEON:  Surgeon(s) and Role:    * Alexis Frock, MD - Primary  PHYSICIAN ASSISTANT:   ASSISTANTS: none   ANESTHESIA:   general  EBL:  Total I/O In: 500 [I.V.:500] Out: -   BLOOD ADMINISTERED:none  DRAINS: none   LOCAL MEDICATIONS USED:  NONE  SPECIMEN:  Source of Specimen:  Rt ureteral stone fragments  DISPOSITION OF SPECIMEN:  [facility] for compositional analysis  COUNTS:  YES  TOURNIQUET:  * No tourniquets in log *  DICTATION: .Other Dictation: Dictation Number [id number]  PLAN OF CARE: Discharge to home after PACU  PATIENT DISPOSITION:  PACU - hemodynamically stable.   Delay start of Pharmacological VTE agent (>24hrs) due to surgical blood loss or risk of bleeding: yes

## 2014-09-28 NOTE — Transfer of Care (Signed)
Last Vitals:  Filed Vitals:   09/28/14 0640  BP: 136/70  Pulse: 71  Temp: 37.1 C  Resp: 17    Immediate Anesthesia Transfer of Care Note  Patient: Elizabeth Huerta  Procedure(s) Performed: Procedure(s) (LRB): CYSTOSCOPY RIGHT RETROGRADE RIGHT URETEROSCOPY (Right) HOLMIUM LASER OF STONES  (Right)  Patient Location: PACU  Anesthesia Type: General  Level of Consciousness: awake, alert  and oriented  Airway & Oxygen Therapy: Patient Spontanous Breathing and Patient connected to face mask oxygen  Post-op Assessment: Report given to PACU RN and Post -op Vital signs reviewed and stable  Post vital signs: Reviewed and stable  Complications: No apparent anesthesia complications

## 2014-09-28 NOTE — H&P (Signed)
Elizabeth Huerta is an 44 y.o. female.    Chief Complaint: pre-op right ureteroscopic stone manipulation  HPI:   1 - Recurrent Nephrolithiasis -  Pre 2016 - SWL x 2 09/2014 - Rt 59m mid ureteral stone with mod-severe hydro (1100HU, SSD 9cm) and only punctate non-obstructing left renal stone. UA withtou infectiuos parameters. Cr 1.06.   2 - Medical Stone Disease -  Eval 2012: BMP,PTH,Urate - normal; Composition - 60%CaOx, 40% CAPO4 Eval 2016: BMP normal; Composition - pending; 24 Hr Urines - pending  3 - Gross Hematuria - pt with visible blood on several occasions, at times of acute colic. non-conatrast CT 2016 with stones but no worriseom GU masses.  PMH sig for HTN. NO CV disease. NO blood thinners. [facility]  Today " Elizabeth Huerta" is seen to proceed with right ureteroscopic stone manipulation. No interval fevers.   Past Medical History  Diagnosis Date  . Hypertension   . Right ureteral stone   . Renal cyst, right   . Nephrolithiasis     LEFT  . History of basal cell carcinoma excision     2010-- nasal area  . Hematuria   . Wears glasses   . Eczema     Past Surgical History  Procedure Laterality Date  . Extracorporeal shock wave lithotripsy Bilateral left 05-15-2010/   right 07-03-2010  . Cesarean section  2001  &  2003  . Mohs surgery  2010    nasal area    History reviewed. No pertinent family history. Social History:  reports that she has never smoked. She has never used smokeless tobacco. She reports that she drinks alcohol. She reports that she does not use illicit drugs.  Allergies:  Allergies  Allergen Reactions  . Latex Rash    No prescriptions prior to admission    No results found for this or any previous visit (from the past 48 hour(s)). No results found.  Review of Systems  Constitutional: Negative.  Negative for fever and chills.  HENT: Negative.   Eyes: Negative.   Respiratory: Negative.    Cardiovascular: Negative.   Gastrointestinal: Negative.   Genitourinary: Positive for hematuria and flank pain.  Musculoskeletal: Negative.   Skin: Negative.   Neurological: Negative.   Endo/Heme/Allergies: Negative.   Psychiatric/Behavioral: Negative.     Last menstrual period 09/11/2014. Physical Exam  Constitutional: She appears well-developed.  HENT:  Head: Normocephalic.  Eyes: Pupils are equal, round, and reactive to light.  Neck: Normal range of motion.  Cardiovascular: Normal rate.   Respiratory: Effort normal.  GI: Soft.  Genitourinary:  Mild Rt CVAT  Musculoskeletal: Normal range of motion.  Neurological: She is alert.  Skin: Skin is warm.  Psychiatric: She has a normal mood and affect. Her behavior is normal. Judgment and thought content normal.     Assessment/Plan    1 - Recurrent Nephrolithiasis - present stone in Rt ureter clearly obstructing, unlikely to pass with medical therapy alone. First choice would be ureteroscopy as stone quite dense, second choice SWL. Rec observation of tiny left sided stone.   We rediscussed ureteroscopic stone manipulation with basketing and laser-lithotripsy in detail.  We rediscussed risks including bleeding, infection, damage to kidney / ureter  bladder, rarely loss of kidney. We discussed anesthetic risks and rare but serious surgical complications including DVT, PE, MI, and mortality. We specifically readdressed that in 5-10% of cases a staged approach is required with stenting followed by re-attempt ureteroscopy  if anatomy unfavorable.   The patient voiced understanding and wishes to proceed today as planned.   2 - Medical Stone Disease - suggest complete re-eval this year with stone analysis and 24 hr urienes to see if any modifiable factors exist.  3 - Gross Hematuria - likely from nephrolithiasis. Potential benefit to ureteroscopy would be concomitant cysto and retrogrades to maximally rule out other etiologies.   Sevag Shearn,  Noha Karasik 09/28/2014, 5:54 AM

## 2014-09-29 NOTE — Op Note (Deleted)
Elizabeth Huerta, CRILLY NO.:  [id number]  MEDICAL RECORD NO.:  [id number]  LOCATION:                               FACILITY:  [facility]  PHYSICIAN:  Alexis Frock, MD     DATE OF BIRTH:  April 26, 1971  DATE OF PROCEDURE: DATE OF DISCHARGE:  09/29/2014                              OPERATIVE REPORT   DIAGNOSES: 1. Right large ureteral stone. 2. Hydronephrosis. 3. Flank pain.  PROCEDURE: 1. Right ureteroscopy with laser lithotripsy. 2. Right retrograde pyelogram interpretation. 3. Insertion of right ureteral stent 5 x 24 Polaris, no tether.  ESTIMATED BLOOD LOSS:  Nil.  COMPLICATIONS:  None.  SPECIMENS:  Right ureteral stone fragments for compositional analysis.  FINDINGS: 1. Moderate hydronephrosis and filling defect in distal ureter     consistent with known stone. 2. Large mid ureteral stone with significant periureteral edema. 3. Complete resolution of all stone fragments larger than 1/3rd mm     following laser lithotripsy and removal.  ESTIMATED BLOOD LOSS:  Nil.  Stent, #15 x 24 Polaris.  INDICATIONS:  Elizabeth Huerta is a pleasant 44 year old lady with a history of prior renal colic.  She was found on evaluation of colicky right flank pain and to have a very large right ureteral stone of approximately 13 mm.  There is moderate hydronephrosis above this.  She had no infectious parameters.  Kidney function was normal.  Options were discussed for management including medical therapies versus shockwave lithotripsy versus ureteroscopy.  She  wished to proceed with the latter.  Informed consent was obtained and placed in medical record.  PROCEDURE IN DETAIL:  The patient being Elizabeth Huerta was verified, procedure being right ureteroscopic stone manipulation.  Procedure was carried out.  Time-out was performed.  Intravenous antibiotics were administered.  General LMA anesthesia was introduced.  The patient placed into a low lithotomy position and sterile field  was created by prepping and draping the patient's vagina, introitus, and proximal thighs using iodine x3.  Next,  cystourethroscopy was performed using a 22-French rigid cystoscope with 12-degree offset lens.  Inspection of urinary bladder revealed no diverticula, calcifications, or papular lesions.  The right ureteral orifice was cannulated with a 6-French angled catheter and right retrograde pyelogram was obtained.  Right retrograde pyelogram demonstrated a single right ureter, single system right kidney.  There was a filling defect from the mid ureter consistent with known stone.  There was moderate hydroureteronephrosis and above this a 0.038 ZIPwire was advanced well into the lower pole and set aside as a safety wire.  Next, semi-rigid ureteroscopy was performed in the distal half of the right ureter alongside a separate Sensor working wire.  An 8-French feeding tube in the urinary bladder for pressure release in the mid ureter.  The stone in question was encountered.  There was significant periureteral edema.  The stone appeared to be much too large for simple basketing.  As such, holmium laser energy applied to the stone using settings of 0.4 joules and 20 hertz and the stone was fragmented into approximately 5 smaller pieces. Some of which remained in the mid ureter, however, the majority of them were passed retrograde  up towards the kidney.  The mid ureteral fragments were sequentially grasped with Escape Basket and brought out in their entirety, set aside for compositional analysis.  Inspection of the proximal ureter revealed no  mucosal abnormalities.  It was felt that significant amount of stone burden had been pushed into the kidney and the goal today is a stone free status on the right and it was felt that flexible ureteroscopy was clearly warranted, as such the semi-rigid ureteroscope was exchanged for the 24-cm 12/14 ureteral access sheath at the level the proximal ureter  under continuous fluoroscopic guidance and flexible digital ureteroscopy was performed.  Inspection of the proximal ureter and systematic inspection of the right kidney did not reveal retrograde positioning of previous ureteral stone fragments into the mid pole of the kidney.  These were grasped sequentially and removed in their entirety.  Following these maneuvers, there was complete resolution of all stone fragments larger than 13 mm.  No evidence of renal perforation.  There was excellent hemostasis.  The sheath was removed under continuous ureteroscopic vision.  No mucosal abnormalities were found.  Given the significant edema at the area of prior stone, it was felt that ureteral stenting will clearly be warranted and as such a new 5 x 24 Polaris-type stent was placed using cystoscopic and fluoroscopic guidance.  Good proximal and distal deployment were noted. Procedure was then terminated.  The patient tolerated procedure well with immediate periprocedural complications.  The patient was taken to postanesthesia care unit in stable condition.          ______________________________ Alexis Frock, MD     TM/MEDQ  D:  09/28/2014  T:  09/29/2014  Job:  [id number]

## 2014-09-29 NOTE — Op Note (Deleted)
Elizabeth Huerta, Elizabeth Huerta NO.:  [id number]  MEDICAL RECORD NO.:  [id number]  LOCATION:                               FACILITY:  [facility]  PHYSICIAN:  Alexis Frock, MD     DATE OF BIRTH:  11-21-1970  DATE OF PROCEDURE: 09/28/2014                               OPERATIVE REPORT   DIAGNOSES: 1. Right large ureteral stone. 2. Hydronephrosis. 3. Flank pain.  PROCEDURE: 1. Right ureteroscopy with laser lithotripsy. 2. Right retrograde pyelogram interpretation. 3. Insertion of right ureteral stent 5 x 24 Polaris, no tether.  ESTIMATED BLOOD LOSS:  Nil.  COMPLICATIONS:  None.  SPECIMENS:  Right ureteral stone fragments for compositional analysis.  FINDINGS: 1. Moderate hydronephrosis and filling defect in distal ureter     consistent with known stone. 2. Large mid ureteral stone with significant periureteral edema. 3. Complete resolution of all stone fragments larger than 1/3rd mm     following laser lithotripsy and removal.  ESTIMATED BLOOD LOSS:  Nil.  Stent, #15 x 24 Polaris.  INDICATIONS:  Elizabeth Huerta is a pleasant 44 year old lady with a history of prior renal colic.  She was found on evaluation of colicky right flank pain and to have a very large right ureteral stone of approximately 13 mm.  There is moderate hydronephrosis above this.  She had no infectious parameters.  Kidney function was normal.  Options were discussed for management including medical therapies versus shockwave lithotripsy versus ureteroscopy.  She  wished to proceed with the latter.  Informed consent was obtained and placed in medical record.  PROCEDURE IN DETAIL:  The patient being Elizabeth Huerta was verified, procedure being right ureteroscopic stone manipulation.  Procedure was carried out.  Time-out was performed.  Intravenous antibiotics were administered.  General LMA anesthesia was introduced.  The patient placed into a low lithotomy position and sterile field was created  by prepping and draping the patient's vagina, introitus, and proximal thighs using iodine x3.  Next,  cystourethroscopy was performed using a 22-French rigid cystoscope with 12-degree offset lens.  Inspection of urinary bladder revealed no diverticula, calcifications, or papular lesions.  The right ureteral orifice was cannulated with a 6-French angled catheter and right retrograde pyelogram was obtained.  Right retrograde pyelogram demonstrated a single right ureter, single system right kidney.  There was a filling defect from the mid ureter consistent with known stone.  There was moderate hydroureteronephrosis and above this a 0.038 ZIPwire was advanced well into the lower pole and set aside as a safety wire.  Next, semi-rigid ureteroscopy was performed in the distal half of the right ureter alongside a separate Sensor working wire.  An 8-French feeding tube in the urinary bladder for pressure release in the mid ureter.  The stone in question was encountered.  There was significant periureteral edema.  The stone appeared to be much too large for simple basketing.  As such, holmium laser energy applied to the stone using settings of 0.4 joules and 20 hertz and the stone was fragmented into approximately 5 smaller pieces. Some of which remained in the mid ureter, however, the majority of them were passed retrograde up towards the  kidney.  The mid ureteral fragments were sequentially grasped with Escape Basket and brought out in their entirety, set aside for compositional analysis.  Inspection of the proximal ureter revealed no  mucosal abnormalities.  It was felt that significant amount of stone burden had been pushed into the kidney and the goal today is a stone free status on the right and it was felt that flexible ureteroscopy was clearly warranted, as such the semi-rigid ureteroscope was exchanged for the 24-cm 12/14 ureteral access sheath at the level the proximal ureter under  continuous fluoroscopic guidance and flexible digital ureteroscopy was performed.  Inspection of the proximal ureter and systematic inspection of the right kidney did not reveal retrograde positioning of previous ureteral stone fragments into the mid pole of the kidney.  These were grasped sequentially and removed in their entirety.  Following these maneuvers, there was complete resolution of all stone fragments larger than 13 mm.  No evidence of renal perforation.  There was excellent hemostasis.  The sheath was removed under continuous ureteroscopic vision.  No mucosal abnormalities were found.  Given the significant edema at the area of prior stone, it was felt that ureteral stenting will clearly be warranted and as such a new 5 x 24 Polaris-type stent was placed using cystoscopic and fluoroscopic guidance.  Good proximal and distal deployment were noted. Procedure was then terminated.  The patient tolerated procedure well with immediate periprocedural complications.  The patient was taken to postanesthesia care unit in stable condition.          ______________________________ Alexis Frock, MD     TM/MEDQ  D:  09/28/2014  T:  09/29/2014  Job:  [id number]

## 2014-09-29 NOTE — Op Note (Signed)
Elizabeth Huerta, Elizabeth Huerta NO.:  [id number]  MEDICAL RECORD NO.:  [id number]  LOCATION:                               FACILITY:  [facility]  PHYSICIAN:  Alexis Frock, MD     DATE OF BIRTH:  1971-03-02  DATE OF PROCEDURE: 09/28/2014                               OPERATIVE REPORT   DIAGNOSES: 1. Right large ureteral stone. 2. Hydronephrosis. 3. Flank pain.  PROCEDURE: 1. Right ureteroscopy with laser lithotripsy. 2. Right retrograde pyelogram interpretation. 3. Insertion of right ureteral stent 5 x 24 Polaris, no tether.  ESTIMATED BLOOD LOSS:  Nil.  COMPLICATIONS:  None.  SPECIMENS:  Right ureteral stone fragments for compositional analysis.  FINDINGS: 1. Moderate hydronephrosis and filling defect in distal ureter     consistent with known stone. 2. Large mid ureteral stone with significant periureteral edema. 3. Complete resolution of all stone fragments larger than 1/3rd mm     following laser lithotripsy and removal.  ESTIMATED BLOOD LOSS:  Nil.  Stent, #15 x 24 Polaris.  INDICATIONS:  Elizabeth Huerta is a pleasant 44 year old lady with a history of prior renal colic.  She was found on evaluation of colicky right flank pain and to have a very large right ureteral stone of approximately 13 mm.  There is moderate hydronephrosis above this.  She had no infectious parameters.  Kidney function was normal.  Options were discussed for management including medical therapies versus shockwave lithotripsy versus ureteroscopy.  She  wished to proceed with the latter.  Informed consent was obtained and placed in medical record.  PROCEDURE IN DETAIL:  The patient being Elizabeth Huerta was verified, procedure being right ureteroscopic stone manipulation.  Procedure was carried out.  Time-out was performed.  Intravenous antibiotics were administered.  General LMA anesthesia was introduced.  The patient placed into a low lithotomy position and sterile field was created  by prepping and draping the patient's vagina, introitus, and proximal thighs using iodine x3.  Next,  cystourethroscopy was performed using a 22-French rigid cystoscope with 12-degree offset lens.  Inspection of urinary bladder revealed no diverticula, calcifications, or papular lesions.  The right ureteral orifice was cannulated with a 6-French angled catheter and right retrograde pyelogram was obtained.  Right retrograde pyelogram demonstrated a single right ureter, single system right kidney.  There was a filling defect from the mid ureter consistent with known stone.  There was moderate hydroureteronephrosis and above this a 0.038 ZIPwire was advanced well into the lower pole and set aside as a safety wire.  Next, semi-rigid ureteroscopy was performed in the distal half of the right ureter alongside a separate Sensor working wire.  An 8-French feeding tube in the urinary bladder for pressure release in the mid ureter.  The stone in question was encountered.  There was significant periureteral edema.  The stone appeared to be much too large for simple basketing.  As such, holmium laser energy applied to the stone using settings of 0.4 joules and 20 hertz and the stone was fragmented into approximately 5 smaller pieces. Some of which remained in the mid ureter, however, the majority of them were passed retrograde up towards the  kidney.  The mid ureteral fragments were sequentially grasped with Escape Basket and brought out in their entirety, set aside for compositional analysis.  Inspection of the proximal ureter revealed no  mucosal abnormalities.  It was felt that significant amount of stone burden had been pushed into the kidney and the goal today is a stone free status on the right and it was felt that flexible ureteroscopy was clearly warranted, as such the semi-rigid ureteroscope was exchanged for the 24-cm 12/14 ureteral access sheath at the level the proximal ureter under  continuous fluoroscopic guidance and flexible digital ureteroscopy was performed.  Inspection of the proximal ureter and systematic inspection of the right kidney did not reveal retrograde positioning of previous ureteral stone fragments into the mid pole of the kidney.  These were grasped sequentially and removed in their entirety.  Following these maneuvers, there was complete resolution of all stone fragments larger than 13 mm.  No evidence of renal perforation.  There was excellent hemostasis.  The sheath was removed under continuous ureteroscopic vision.  No mucosal abnormalities were found.  Given the significant edema at the area of prior stone, it was felt that ureteral stenting will clearly be warranted and as such a new 5 x 24 Polaris-type stent was placed using cystoscopic and fluoroscopic guidance.  Good proximal and distal deployment were noted. Procedure was then terminated.  The patient tolerated procedure well with immediate periprocedural complications.  The patient was taken to postanesthesia care unit in stable condition.          ______________________________ Alexis Frock, MD     TM/MEDQ  D:  09/28/2014  T:  09/29/2014  Job:  [id number]

## 2014-10-01 ENCOUNTER — Encounter (HOSPITAL_BASED_OUTPATIENT_CLINIC_OR_DEPARTMENT_OTHER): Payer: Self-pay | Admitting: Urology

## 2015-02-15 ENCOUNTER — Other Ambulatory Visit: Payer: Self-pay

## 2015-02-15 DIAGNOSIS — Z1231 Encounter for screening mammogram for malignant neoplasm of breast: Secondary | ICD-10-CM

## 2015-03-20 ENCOUNTER — Ambulatory Visit
Admission: RE | Admit: 2015-03-20 | Discharge: 2015-03-20 | Disposition: A | Discharge status: Home / Self Care | Payer: Federal, State, Local not specified - PPO | Source: Ambulatory Visit

## 2015-03-20 DIAGNOSIS — Z1231 Encounter for screening mammogram for malignant neoplasm of breast: Secondary | ICD-10-CM

## 2015-06-18 ENCOUNTER — Telehealth: Payer: Self-pay | Admitting: Internal Medicine

## 2015-06-18 ENCOUNTER — Other Ambulatory Visit (INDEPENDENT_AMBULATORY_CARE_PROVIDER_SITE_OTHER): Payer: Federal, State, Local not specified - PPO

## 2015-06-18 DIAGNOSIS — R197 Diarrhea, unspecified: Secondary | ICD-10-CM | POA: Diagnosis not present

## 2015-06-18 LAB — COMPREHENSIVE METABOLIC PANEL
ALT: 12 U/L (ref 0–35)
AST: 13 U/L (ref 0–37)
Albumin: 3.9 g/dL (ref 3.5–5.2)
Alkaline Phosphatase: 66 U/L (ref 39–117)
BUN: 12 mg/dL (ref 6–23)
CO2: 32 mEq/L (ref 19–32)
Calcium: 9.4 mg/dL (ref 8.4–10.5)
Chloride: 99 mEq/L (ref 96–112)
Creatinine, Ser: 0.67 mg/dL (ref 0.40–1.20)
GFR: 100.75 mL/min (ref >60.00)
Glucose, Bld: 115 mg/dL — ABNORMAL HIGH (ref 70–99)
Potassium: 3 mEq/L — ABNORMAL LOW (ref 3.5–5.1)
Sodium: 137 mEq/L (ref 135–145)
Total Bilirubin: 0.7 mg/dL (ref 0.2–1.2)
Total Protein: 7.2 g/dL (ref 6.0–8.3)

## 2015-06-18 LAB — CBC WITH DIFFERENTIAL/PLATELET
Basophils Absolute: 0 10*3/uL (ref 0.0–0.1)
Basophils Relative: 0.3 % (ref 0.0–3.0)
Eosinophils Absolute: 0.2 10*3/uL (ref 0.0–0.7)
Eosinophils Relative: 1.6 % (ref 0.0–5.0)
HCT: 42 % (ref 36.0–46.0)
Hemoglobin: 13.9 g/dL (ref 12.0–15.0)
Lymphocytes Relative: 24.9 % (ref 12.0–46.0)
Lymphs Abs: 2.3 10*3/uL (ref 0.7–4.0)
MCHC: 33.1 g/dL (ref 30.0–36.0)
MCV: 85.1 fl (ref 78.0–100.0)
Monocytes Absolute: 0.6 10*3/uL (ref 0.1–1.0)
Monocytes Relative: 7 % (ref 3.0–12.0)
Neutro Abs: 6.2 10*3/uL (ref 1.4–7.7)
Neutrophils Relative %: 66.2 % (ref 43.0–77.0)
Platelets: 393 10*3/uL (ref 150.0–400.0)
RBC: 4.94 Mil/uL (ref 3.87–5.11)
RDW: 12.6 % (ref 11.5–15.5)
WBC: 9.3 10*3/uL (ref 4.0–10.5)

## 2015-06-18 NOTE — Telephone Encounter (Signed)
Having 1 month of diarrhea w/ some nocturnal sxs Mild abd pain No fever or bleeding No recent Abx   Saw Dr. Paulita Fujita 2 yrs ago and Florastor helped diarrhea after Abx then   No prior colonoscopy   I told her I would see her 11/3  She needs to do a CBC, CMET, stool Cx and stool C diff, giardia/crypyto and lactoferrin to evaluate diarrhea before please

## 2015-06-18 NOTE — Telephone Encounter (Signed)
Patient notified She will come for labs today  Office visit scheduled for 06/20/15 10:45

## 2015-06-19 ENCOUNTER — Other Ambulatory Visit: Payer: Federal, State, Local not specified - PPO

## 2015-06-19 DIAGNOSIS — R197 Diarrhea, unspecified: Secondary | ICD-10-CM

## 2015-06-20 ENCOUNTER — Other Ambulatory Visit: Payer: Self-pay

## 2015-06-20 ENCOUNTER — Telehealth: Payer: Self-pay | Admitting: Internal Medicine

## 2015-06-20 ENCOUNTER — Encounter: Payer: Self-pay | Admitting: Internal Medicine

## 2015-06-20 ENCOUNTER — Ambulatory Visit (INDEPENDENT_AMBULATORY_CARE_PROVIDER_SITE_OTHER): Payer: Federal, State, Local not specified - PPO | Admitting: Internal Medicine

## 2015-06-20 VITALS — BP 110/70 | HR 80 | Ht 63.0 in | Wt 160.1 lb

## 2015-06-20 DIAGNOSIS — A047 Enterocolitis due to Clostridium difficile: Secondary | ICD-10-CM | POA: Diagnosis not present

## 2015-06-20 DIAGNOSIS — E876 Hypokalemia: Secondary | ICD-10-CM | POA: Diagnosis not present

## 2015-06-20 DIAGNOSIS — A0472 Enterocolitis due to Clostridium difficile, not specified as recurrent: Secondary | ICD-10-CM

## 2015-06-20 DIAGNOSIS — R197 Diarrhea, unspecified: Secondary | ICD-10-CM | POA: Diagnosis not present

## 2015-06-20 HISTORY — DX: Enterocolitis due to Clostridium difficile, not specified as recurrent: A04.72

## 2015-06-20 LAB — GIARDIA/CRYPTOSPORIDIUM (EIA)
Cryptosporidium Screen (EIA): NEGATIVE
Giardia Screen (EIA): NEGATIVE

## 2015-06-20 LAB — CLOSTRIDIUM DIFFICILE BY PCR: Toxigenic C. Difficile by PCR: DETECTED — CR

## 2015-06-20 LAB — FECAL LACTOFERRIN, QUANT: Lactoferrin: POSITIVE

## 2015-06-20 MED ORDER — POTASSIUM CHLORIDE 20 MEQ PO PACK: 20.0000 meq | PACK | Freq: Two times a day (BID) | ORAL | Status: DC

## 2015-06-20 MED ORDER — VANCOMYCIN HCL 125 MG PO CAPS: 125.0000 mg | ORAL_CAPSULE | Freq: Four times a day (QID) | ORAL | Status: DC

## 2015-06-20 NOTE — Telephone Encounter (Signed)
See c-diff results for additional results and orders

## 2015-06-20 NOTE — Progress Notes (Signed)
Quick Note:  See prior result note - sent to [facility] but not sure it has been seen yet ______

## 2015-06-20 NOTE — Progress Notes (Signed)
Quick Note:  She has C diff Vancomycin 125 mg qid x 10 d please If that does not resolve things she should let us know - should see some response in 3-5 d  Ask her to f/u PCP re: hypokalemia when this is over ______

## 2015-06-20 NOTE — Progress Notes (Signed)
   Subjective:    Patient ID: Elizabeth Huerta, female    DOB: 1970-11-02, 44 y.o.   MRN: [id number] Cc: diarrhea HPI Very nice married ww here with 1 month hx watery diarrhea - worsening over time. Several x a day, occurs pc and other times even nocturnal. Slight assoc abd pain. No fever or rectal bleeding. Has lost 12 #.  No travel, sick contacts. Last Abx likely 09/2014 when she had cystocopy, ureteroscopy and stone extraction.  1-2 yrs ago saw Dr. Paulita Fujita doe diarrhea evaluation - got better on Florastor which she has restarted w/o help. Imodium no help either  Has felt somewhat dizzy and week at times. Appetite is off. Medications, allergies, past medical history, past surgical history, family history and social history are reviewed and updated in the EMR.  Review of Systems As above, all other neg    Objective:   Physical Exam @BP  110/70 mmHg  Pulse 80  Ht 5' 3"  (1.6 m)  Wt 160 lb 2 oz (72.632 kg)  BMI 28.37 kg/m2  LMP 06/01/2015@  General:  Well-developed, well-nourished and in no acute distress Eyes:  anicteric. ENT:   Mouth and posterior pharynx free of lesions.  Neck:   supple w/o thyromegaly or mass.  Lungs: Clear to auscultation bilaterally. Heart:  S1S2, no rubs, murmurs, gallops. Abdomen:  soft, non-tender, no hepatosplenomegaly, hernia, or mass and BS+.  Lymph:  no cervical or supraclavicular adenopathy. Extremities:   no edema, cyanosis or clubbing Skin   no rash. Neuro:  A&O x 3.  Psych:  appropriate mood and  Affect.   Data Reviewed:  So far CBC, CMEt notable for hypokalemia Fecal lactoferrin + Tol Cx pend as is C diff and Giardia crypto screen - these were drawn prior to visit      Assessment & Plan:  Diarrhea, unspecified type  Hypokalemia - Plan: potassium chloride (KLOR-CON) 20 MEQ packet   C fiff PCR retrned + after visit - will start 125 mg vancomycin x 10 d F/u prn Kckl 21mq x 2 each day and f/u PCP - could be from diuretic, diarrhea or  both. Advised to hold BP meds if feels very weak/dizzy on prolonged basis.  I appreciate the opportunity to care for this patient. CRN:HAFBX JAnderson Malta NP

## 2015-06-20 NOTE — Patient Instructions (Signed)
  If your feeling really weak don't take your BP pressure medicine per Dr Carlean Purl.   We have sent the following medications to your pharmacy for you to pick up at your convenience: Potassium   We are awaiting the stool study results.    I appreciate the opportunity to care for you. Silvano Rusk, MD, [facility]

## 2015-06-23 LAB — STOOL CULTURE

## 2015-07-01 ENCOUNTER — Telehealth: Payer: Self-pay | Admitting: Internal Medicine

## 2015-07-01 MED ORDER — ONDANSETRON HCL 4 MG PO TABS: 4.0000 mg | ORAL_TABLET | Freq: Three times a day (TID) | ORAL | Status: DC | PRN

## 2015-07-01 MED ORDER — VANCOMYCIN HCL 125 MG PO CAPS: 125.0000 mg | ORAL_CAPSULE | Freq: Four times a day (QID) | ORAL | Status: DC

## 2015-07-01 NOTE — Telephone Encounter (Signed)
Patient notified rx sent for both meds She will come for follow up on 07/18/15 11:15

## 2015-07-01 NOTE — Telephone Encounter (Signed)
Patient reports that she is having diarrhea about 5 stools a day. She had some vomiting yesterday also.  She took her last dose of Vanc today.  She reports that her stools have never been formed.  Please advise

## 2015-07-01 NOTE — Telephone Encounter (Signed)
Sorry to hear that  Let's treat x 2 weeks more with refill of vancomycin  Ondansetron 4 mg q 8 prn # 30 1 RF  Needs an REV at some point in near end of that Tx or soon after

## 2015-07-08 NOTE — Progress Notes (Signed)
Quick Note:  F/u call She is much better She will see me 12/1 ______

## 2015-07-18 ENCOUNTER — Encounter: Payer: Self-pay | Admitting: Internal Medicine

## 2015-07-18 ENCOUNTER — Ambulatory Visit (INDEPENDENT_AMBULATORY_CARE_PROVIDER_SITE_OTHER): Payer: Federal, State, Local not specified - PPO | Admitting: Internal Medicine

## 2015-07-18 VITALS — BP 106/60 | HR 64 | Ht 63.0 in | Wt 161.0 lb

## 2015-07-18 DIAGNOSIS — A047 Enterocolitis due to Clostridium difficile: Secondary | ICD-10-CM

## 2015-07-18 DIAGNOSIS — A0472 Enterocolitis due to Clostridium difficile, not specified as recurrent: Secondary | ICD-10-CM

## 2015-07-18 NOTE — Assessment & Plan Note (Addendum)
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

## 2015-07-18 NOTE — Progress Notes (Signed)
   Subjective:    Patient ID: Elizabeth Huerta, female    DOB: [birth year], 44 y.o.   MRN: [id number] Chief complaint: Follow-up C. difficile HPI The patient is here after his second treatment course of vancomycin 125 mg 4 times a day. She was originally seen and diagnosed, treated for 10 days, and then call back sooner after completing that with having persistent or recurrent diarrhea. She did not really resolve so I treated her for 2 more weeks. She's been off that for several days and stool frequency is about 1 a day and is soft or loose but overall she feels much better. Medications, allergies, past medical history, past surgical history, family history and social history are reviewed and updated in the EMR.   Review of Systems As above    Objective:   Physical Exam BP 106/60 mmHg  Pulse 64  Ht 5\' 3"  (1.6 m)  Wt 161 lb (73.029 kg)  BMI 28.53 kg/m2  LMP 07/02/2015 Wt Readings from Last 3 Encounters:  07/18/15 161 lb (73.029 kg)  06/20/15 160 lb 2 oz (72.632 kg)  09/28/14 192 lb (87.091 kg)       Assessment & Plan:   1. C. difficile colitis    She seems significantly improved at this point. I hope she is resolved. Time will tell. I did explain to her that sometimes there can be sort of a false positive C. difficile for concomitant C. difficile with IBD and if she has recurrent problems I will treat her, though I would retest C. difficile PCR and consider a colonoscopy at some point. She will let me know, she will continue floor store.  CC: Eloise Levels, NP

## 2015-07-18 NOTE — Patient Instructions (Signed)
  Per Dr Carlean Purl please continue your florastor.    Call us if needed.    I appreciate the opportunity to care for you. Silvano Rusk, MD, [facility]

## 2015-07-19 ENCOUNTER — Encounter: Payer: Self-pay | Admitting: Internal Medicine

## 2015-08-05 ENCOUNTER — Telehealth: Payer: Self-pay | Admitting: Internal Medicine

## 2015-08-05 ENCOUNTER — Other Ambulatory Visit: Payer: Federal, State, Local not specified - PPO

## 2015-08-05 DIAGNOSIS — R197 Diarrhea, unspecified: Secondary | ICD-10-CM

## 2015-08-05 NOTE — Telephone Encounter (Signed)
Pt states she had cdiff and finished her vancomycin around the end of Nov. States she is having some nausea and stomach pain along with diarrhea and is afraid the cdiff may be back. Pt is calling wanting to be tested for cdiff. Please advise.

## 2015-08-05 NOTE — Telephone Encounter (Signed)
If having watery stools she does need to have a C diff PCR done re: diarrhea

## 2015-08-05 NOTE — Telephone Encounter (Signed)
Pt will come today, order in epic.

## 2015-12-09 DIAGNOSIS — Z0000 Encounter for general adult medical examination without abnormal findings: Secondary | ICD-10-CM | POA: Diagnosis not present

## 2015-12-09 DIAGNOSIS — R310 Gross hematuria: Secondary | ICD-10-CM | POA: Diagnosis not present

## 2015-12-09 DIAGNOSIS — N200 Calculus of kidney: Secondary | ICD-10-CM | POA: Diagnosis not present

## 2016-06-10 DIAGNOSIS — K080 Exfoliation of teeth due to systemic causes: Secondary | ICD-10-CM | POA: Diagnosis not present

## 2016-07-02 ENCOUNTER — Ambulatory Visit (INDEPENDENT_AMBULATORY_CARE_PROVIDER_SITE_OTHER): Payer: Federal, State, Local not specified - PPO | Admitting: Nurse Practitioner

## 2016-07-02 ENCOUNTER — Encounter: Payer: Self-pay | Admitting: Nurse Practitioner

## 2016-07-02 VITALS — BP 136/84 | HR 76 | Ht 63.0 in | Wt 162.0 lb

## 2016-07-02 DIAGNOSIS — R194 Change in bowel habit: Secondary | ICD-10-CM | POA: Diagnosis not present

## 2016-07-02 DIAGNOSIS — R112 Nausea with vomiting, unspecified: Secondary | ICD-10-CM | POA: Diagnosis not present

## 2016-07-02 MED ORDER — ONDANSETRON HCL 4 MG PO TABS: 4.0000 mg | ORAL_TABLET | Freq: Four times a day (QID) | ORAL | 1 refills | Status: DC | PRN

## 2016-07-02 NOTE — Patient Instructions (Signed)
Please purchase the following medications over the counter and take as directed: IB gard as directed.   We have sent the following medications to your pharmacy for you to pick up at your convenience: Zofran 4 mg every 6 hrs as needed for nausea.   Your physician has requested that you go to the basement for lab work before leaving today.  We will call you with the results.

## 2016-07-05 NOTE — Progress Notes (Signed)
     HPI: Patient is a 45 yo female known to Dr. Carlean Purl. She had a history of C. difficile colitis in 2016. It took a long time for her bowels to get back to some normalcy and still has to be careful with certain foods. Over the last few months patient has had increasing intestinal gas, abdominal discomfort,, nausea and frequent losse stools. She takes twice daily probiotics and increase dose to 3 times a day to see if things would improve. No recent antibiotics but she did not have any to prior to being diagnosed with C. difficile last year. No recent medication changes. Patient is going to [place] soon, she is obviously concerned about being away with ongoing GI symptoms.   Past Medical History:  Diagnosis Date  . C. difficile colitis 06/20/2015  . Clostridium difficile infection   . Eczema   . Hematuria   . History of basal cell carcinoma excision    2010-- nasal area  . Hypertension   . Nephrolithiasis    LEFT  . Renal cyst, right   . Right ureteral stone     Patient's surgical history, family medical history, social history, medications and allergies were all reviewed in Epic    Physical Exam: BP 136/84   Pulse 76   Ht 5' 3"  (1.6 m)   Wt 162 lb (73.5 kg)   BMI 28.70 kg/m   GENERAL: well developed white female in NAD PSYCH: :Pleasant, cooperative, normal affect HEENT: Normocephalic, conjunctiva pink, mucous membranes moist, neck supple without masses CARDIAC:  RRR, no murmur heard, no peripheral edema PULM: Normal respiratory effort, lungs CTA bilaterally, no wheezing ABDOMEN:  soft, nontender, nondistended, no obvious masses, no hepatomegaly,  normal bowel sounds SKIN:  turgor, no lesions seen Musculoskeletal:  Normal muscle tone, normal strength NEURO: Alert and oriented x 3, no focal neurologic deficits   ASSESSMENT and PLAN:  62. 45 year old female with increased gas, nausea, and frequent loose stool. Hx of  C-diff (2016). It took several months to get back some  degree of normalcy following C-diff colitis. Now with recent increase in symptoms similar to C-diff. . -Check stool for C. Difficile -Continue twice daily probiotics -Avoid any trigger foods -She does not appear toxic in any way so it is fine to use Imodium as needed.  -We will call her with test results. If C. difficile is positive again then would consider colonoscopy in the near future to rule out underlying IBD, especially in the absence of usual C-diff risk factors. If C-diff is negative but symptoms persist then she may still need a colonoscopy to look for what else might be going on . 2. Weight loss. Weight down 30 pounds,  192----> 162 since February 2016, most of this following c-diff colitis. Weight stable over last couple of weeks.    Tye Savoy  07/05/2016, 5:03 PM

## 2016-07-08 NOTE — Progress Notes (Signed)
Agree with Ms. Guenther's assessment and plan. Faisal Stradling E. Kyrstyn Greear, MD, FACG   

## 2016-08-05 ENCOUNTER — Ambulatory Visit (INDEPENDENT_AMBULATORY_CARE_PROVIDER_SITE_OTHER): Payer: Federal, State, Local not specified - PPO | Admitting: Obstetrics and Gynecology

## 2016-08-05 ENCOUNTER — Encounter: Payer: Self-pay | Admitting: Obstetrics and Gynecology

## 2016-08-05 VITALS — BP 120/80 | HR 78 | Resp 14 | Ht 63.0 in | Wt 163.8 lb

## 2016-08-05 DIAGNOSIS — Z0000 Encounter for general adult medical examination without abnormal findings: Secondary | ICD-10-CM | POA: Diagnosis not present

## 2016-08-05 DIAGNOSIS — Z01419 Encounter for gynecological examination (general) (routine) without abnormal findings: Secondary | ICD-10-CM | POA: Diagnosis not present

## 2016-08-05 LAB — POCT URINALYSIS DIPSTICK
Bilirubin, UA: NEGATIVE
Blood, UA: NEGATIVE
Glucose, UA: NEGATIVE
Ketones, UA: NEGATIVE
Leukocytes, UA: NEGATIVE
Nitrite, UA: NEGATIVE
Protein, UA: NEGATIVE
Urobilinogen, UA: NEGATIVE
pH, UA: 7

## 2016-08-05 NOTE — Patient Instructions (Signed)

## 2016-08-05 NOTE — Progress Notes (Signed)
45 y.o. G24P2002 Married Caucasian female here for annual exam.    Going through perimenopause.  Some sleep disturbance, hot flashes, acne, depression and anxiety symptoms.  Menses are 7 days and frequent pad change every 1.5 hours at times. Used OCPs in the past and did well with them.   Does have HTN. Now off antiHTN due to weight loss from C Diff. Blood pressure still fluctuates.  Sister in law died suddenly this summer.   Referred by a former neighbor of mine.  PCP: Eloise Levels, NP @ [facility]    Patient's last menstrual period was 08/04/2016.     Period Cycle (Days): 28 Period Duration (Days): 7 Period Pattern: Regular Menstrual Flow: Heavy, Light Menstrual Control: Tampon Menstrual Control Change Freq (Hours): q 1-2 hrs Dysmenorrhea: None     Sexually active: Yes.    The current method of family planning is vasectomy.    Exercising: No.  The patient does not participate in regular exercise at present. Smoker:  no  Health Maintenance: Pap: 04/19/13 WNL  History of abnormal Pap:  no MMG: 03/21/15 BIRADS1, Density B, [facility] Colonoscopy: n/a BMD:   n/a  Result  n/a TDaP: PCP HIV: 2004 Hep C: no Screening Labs: Patient does not want any blood work today  Hb today: n/a, Urine today: Neg   reports that she has quit smoking. Her smoking use included Cigarettes. She quit after 2.00 years of use. She has never used smokeless tobacco. She reports that she drinks about 1.2 - 1.8 oz of alcohol per week . She reports that she does not use drugs.  Past Medical History:  Diagnosis Date  . C. difficile colitis 06/20/2015  . Clostridium difficile infection   . Eczema   . Hematuria   . History of basal cell carcinoma excision    2010-- nasal area  . Hypertension   . Nephrolithiasis    LEFT  . Renal cyst, right   . Right ureteral stone     Past Surgical History:  Procedure Laterality Date  . CESAREAN SECTION  2001  &  2003  . CYSTOSCOPY/RETROGRADE/URETEROSCOPY Right  09/28/2014   Procedure: CYSTOSCOPY RIGHT RETROGRADE RIGHT URETEROSCOPY;  Surgeon: Alexis Frock, MD;  Location: [facility];  Service: Urology;  Laterality: Right;  . EXTRACORPOREAL SHOCK WAVE LITHOTRIPSY Bilateral left 05-15-2010/   right 07-03-2010  . HOLMIUM LASER APPLICATION Right 10/06/2540   Procedure: HOLMIUM LASER OF STONES ;  Surgeon: Alexis Frock, MD;  Location: [facility];  Service: Urology;  Laterality: Right;  . MOHS SURGERY  2010   nasal area    Current Outpatient Prescriptions  Medication Sig Dispense Refill  . cetirizine (ZYRTEC) 10 MG tablet Take 10 mg by mouth at bedtime.     . indapamide (LOZOL) 2.5 MG tablet Take 1 tablet by mouth daily.    . Melatonin 3 MG TABS Take 3 mg by mouth at bedtime.     . ondansetron (ZOFRAN) 4 MG tablet Take 1 tablet (4 mg total) by mouth every 6 (six) hours as needed for nausea or vomiting. 30 tablet 1  . saccharomyces boulardii (FLORASTOR) 250 MG capsule Take 250 mg by mouth 2 (two) times daily.     No current facility-administered medications for this visit.     Family History  Problem Relation Age of Onset  . Hypertension Father   . Heart disease Father     stents  . Skin cancer Father   . Hypertension Sister   .  Asthma Mother   . COPD Mother   . Skin cancer Mother     ROS:  Pertinent items are noted in HPI.  Otherwise, a comprehensive ROS was negative.  Exam:   BP 120/80 (BP Location: Right Arm, Patient Position: Sitting, Cuff Size: Normal)   Pulse 78   Resp 14   Ht 5' 3"  (1.6 m)   Wt 163 lb 12.8 oz (74.3 kg)   LMP 08/04/2016   BMI 29.02 kg/m     General appearance: alert, cooperative and appears stated age Head: Normocephalic, without obvious abnormality, atraumatic Neck: no adenopathy, supple, symmetrical, trachea midline and thyroid normal to inspection and palpation Lungs: clear to auscultation bilaterally Breasts: normal appearance, no masses or tenderness, No nipple retraction or  dimpling, No nipple discharge or bleeding, No axillary or supraclavicular adenopathy Heart: regular rate and rhythm Abdomen: soft, non-tender; no masses, no organomegaly Extremities: extremities normal, atraumatic, no cyanosis or edema Skin: Skin color, texture, turgor normal. No rashes or lesions Lymph nodes: Cervical, supraclavicular, and axillary nodes normal. No abnormal inguinal nodes palpated Neurologic: Grossly normal  Pelvic: External genitalia:  no lesions              Urethra:  normal appearing urethra with no masses, tenderness or lesions              Bartholins and Skenes: normal                 Vagina: normal appearing vagina with normal color and discharge, no lesions              Cervix: no lesions.  Menstrual flow noted.  Too much to be able to do a pap today.              Pap taken: Yes.   Bimanual Exam:  Uterus:  normal size, contour, position, consistency, mobility, non-tender              Adnexa: no mass, fullness, tenderness              Rectal exam: Yes.  .  Confirms.              Anus:  normal sphincter tone, no lesions  Chaperone was present for exam.  Assessment:   Well woman visit with normal exam. Perimenopausal female.  Cycles are still regular. Menorrhagia. HTN.  Currently off meds. Hx C diff.  Plan: Discussed mammogram.  She will call to schedule. Recommended self breast awareness.  Guidelines for Calcium, Vitamin D, regular exercise program including cardiovascular and weight bearing exercise. OrthoMicronor x 12 months.  Follow up in 3 months for a recheck and do pap and HR HPV testing then.  If menorrhagia persists, needs pelvic ultrasound and EMB. Declines labs. Follow up annually and prn.      After visit summary provided.

## 2016-08-14 ENCOUNTER — Other Ambulatory Visit: Payer: Self-pay | Admitting: Obstetrics and Gynecology

## 2016-08-14 ENCOUNTER — Telehealth: Payer: Self-pay | Admitting: Obstetrics and Gynecology

## 2016-08-14 MED ORDER — NORETHINDRONE 0.35 MG PO TABS: 1.0000 | ORAL_TABLET | Freq: Every day | ORAL | 11 refills | Status: DC

## 2016-08-14 NOTE — Telephone Encounter (Signed)
Rx for Micronor sent in to her pharmacy.  You may close the encounter.

## 2016-08-14 NOTE — Telephone Encounter (Signed)
Looks like patient was to be prescribed OrthoMicronor at last visit on 08/05/16. Patient is calling requesting it be sent into her pharmacy on file. Please advise. Thank you.

## 2016-08-14 NOTE — Telephone Encounter (Signed)
Patient called stating her prescription for progesterone was not sent to the pharmacy at her last appointment.  [facility] [address]

## 2016-08-26 ENCOUNTER — Other Ambulatory Visit: Payer: Self-pay | Admitting: Obstetrics and Gynecology

## 2016-08-26 DIAGNOSIS — Z1231 Encounter for screening mammogram for malignant neoplasm of breast: Secondary | ICD-10-CM

## 2016-09-22 ENCOUNTER — Ambulatory Visit
Admission: RE | Admit: 2016-09-22 | Discharge: 2016-09-22 | Disposition: A | Discharge status: Home / Self Care | Payer: Federal, State, Local not specified - PPO | Source: Ambulatory Visit | Attending: Obstetrics and Gynecology | Admitting: Obstetrics and Gynecology

## 2016-09-22 DIAGNOSIS — Z1231 Encounter for screening mammogram for malignant neoplasm of breast: Secondary | ICD-10-CM | POA: Diagnosis not present

## 2016-12-22 DIAGNOSIS — R310 Gross hematuria: Secondary | ICD-10-CM | POA: Diagnosis not present

## 2016-12-22 DIAGNOSIS — N200 Calculus of kidney: Secondary | ICD-10-CM | POA: Diagnosis not present

## 2016-12-23 DIAGNOSIS — K080 Exfoliation of teeth due to systemic causes: Secondary | ICD-10-CM | POA: Diagnosis not present

## 2017-02-14 HISTORY — PX: COLONOSCOPY: SHX174

## 2017-02-16 ENCOUNTER — Telehealth: Payer: Self-pay | Admitting: Internal Medicine

## 2017-02-16 NOTE — Telephone Encounter (Signed)
Patient with diarrhea, abdominal pain, and nausea.  She will come in see Tye Savoy RNP on 02/18/17 1:30

## 2017-02-18 ENCOUNTER — Encounter: Payer: Self-pay | Admitting: Nurse Practitioner

## 2017-02-18 ENCOUNTER — Ambulatory Visit (INDEPENDENT_AMBULATORY_CARE_PROVIDER_SITE_OTHER): Payer: Federal, State, Local not specified - PPO | Admitting: Nurse Practitioner

## 2017-02-18 VITALS — BP 108/78 | HR 68 | Ht 63.0 in | Wt 167.4 lb

## 2017-02-18 DIAGNOSIS — R634 Abnormal weight loss: Secondary | ICD-10-CM | POA: Diagnosis not present

## 2017-02-18 DIAGNOSIS — R194 Change in bowel habit: Secondary | ICD-10-CM

## 2017-02-18 DIAGNOSIS — R110 Nausea: Secondary | ICD-10-CM | POA: Diagnosis not present

## 2017-02-18 MED ORDER — RANITIDINE HCL 150 MG PO TABS: 150.0000 mg | ORAL_TABLET | Freq: Every morning | ORAL | 3 refills | Status: DC

## 2017-02-18 NOTE — Patient Instructions (Signed)
If you are age 48 or older, your body mass index should be between 23-30. Your Body mass index is 29.65 kg/m. If this is out of the aforementioned range listed, please consider follow up with your Primary Care Provider.  If you are age 72 or younger, your body mass index should be between 19-25. Your Body mass index is 29.65 kg/m. If this is out of the aformentioned range listed, please consider follow up with your Primary Care Provider.   You have been scheduled for a colonoscopy. Please follow written instructions given to you at your visit today.  Please pick up your prep supplies at the pharmacy within the next 1-3 days. If you use inhalers (even only as needed), please bring them with you on the day of your procedure. Your physician has requested that you go to www.startemmi.com and enter the access code given to you at your visit today. This web site gives a general overview about your procedure. However, you should still follow specific instructions given to you by our office regarding your preparation for the procedure.  We have sent the following medications to your pharmacy for you to pick up at your convenience: Zantac 150 mg  Thank you for choosing me and [facility].   Tye Savoy, NP

## 2017-02-18 NOTE — Progress Notes (Addendum)
HPI: Patient is a 47 yo female known to Dr. Carlean Purl. She got C-diff in November 2016 without any of the usual risk factors. She was treated with Vanco but required a second round for persistent diarrhea. It took a while for her stools to normalize but they finally did. I saw her in November 2017 for abdominal discomfort, nausea and loose stools. She was on probiotics and tried increasing the dose with improvement.   She had lost a significant amount of weight, about 30 pounds following C-diff colitis but her weight had stabilized. Plan was to check stool for c-diff then regroup but loose stool resolved. She did well for months but had to be careful to avoid triggers.  The nausea has never really improved. She has frequent nausea and actually vomited weekend before last and this past weekend as well. She is also having loose stools again as well. Not like when she had c-diff with multiple episodes a day. Stools have been loose but not malodorous and only a couple of times a day so she doesn't think it is c-diff this time. She did consume a small amount of cake and raw veggies which are known triggers but that was almost two weeks ago. Her stools don't use contain blood or mucous. She has no fevers, arthalgia or rashes.  No recent antibiotics. No out of country travels.      Past Medical History:  Diagnosis Date  . C. difficile colitis 06/20/2015  . Clostridium difficile infection   . Eczema   . Hematuria   . History of basal cell carcinoma excision    2010-- nasal area  . Hypertension   . Nephrolithiasis    LEFT  . Renal cyst, right   . Right ureteral stone     Patient's surgical history, family medical history, social history, medications and allergies were all reviewed in Epic    Physical Exam: BP 108/78   Pulse 68   Ht 5\' 3"  (1.6 m)   Wt 167 lb 6.4 oz (75.9 kg)   LMP 02/04/2017   BMI 29.65 kg/m   GENERAL: well developed white female in NAD PSYCH: :Pleasant, cooperative,  normal affect EENT:  conjunctiva pink, mucous membranes moist, neck supple without masses CARDIAC:  RRR, no murmur heard, no peripheral edema PULM: Normal respiratory effort, lungs CTA bilaterally, no wheezing ABDOMEN:  soft, nontender, nondistended, no obvious masses, no hepatomegaly,  normal bowel sounds SKIN:  turgor, no lesions seen Musculoskeletal:  Normal muscle tone,  normal strength NEURO: Alert and oriented x 3, no focal neurologic deficits    ASSESSMENT and PLAN:  Pleasant 46 year old female with recurring episodes of diarrhea. She had c-diff in 2016 Currently stools loose but not malodorous and only a couple of times a day, not like when she had C-diff. Low suspicion for C-diff this time. She has been struggling for months with frequent nausea as well.  -Given recurring bowel changes it is reasonable to proceed with a colonoscopy for further evaluation. This may turn out to be IBS but need to exclude IBD or other pathology. The risks and benefits of the procedure were discussed and the patient agrees to proceed. -She needs an EGD for evaluation of persistent nausea. Her weight is stable. Plan is to get this done following #1 and after she returns from vacation in [place]. Zofran as needed in the interim.    Tye Savoy , NP 02/18/2017, 1:37 PM  Agree with Elizabeth Huerta's assessment and plan.  Colonoscopy showed ulcerative colitis. Will not need an EGD Gatha Mayer, MD, [facility]

## 2017-02-23 ENCOUNTER — Ambulatory Visit (AMBULATORY_SURGERY_CENTER): Payer: Federal, State, Local not specified - PPO | Admitting: Internal Medicine

## 2017-02-23 ENCOUNTER — Encounter: Payer: Self-pay | Admitting: Internal Medicine

## 2017-02-23 VITALS — BP 118/75 | HR 63 | Temp 99.1°F | Resp 11 | Ht 63.0 in | Wt 167.0 lb

## 2017-02-23 DIAGNOSIS — Z1211 Encounter for screening for malignant neoplasm of colon: Secondary | ICD-10-CM | POA: Diagnosis not present

## 2017-02-23 DIAGNOSIS — R197 Diarrhea, unspecified: Secondary | ICD-10-CM | POA: Diagnosis present

## 2017-02-23 DIAGNOSIS — K5190 Ulcerative colitis, unspecified, without complications: Secondary | ICD-10-CM

## 2017-02-23 DIAGNOSIS — K529 Noninfective gastroenteritis and colitis, unspecified: Secondary | ICD-10-CM | POA: Diagnosis not present

## 2017-02-23 MED ORDER — DIPHENOXYLATE-ATROPINE 2.5-0.025 MG PO TABS: 1.0000 | ORAL_TABLET | Freq: Four times a day (QID) | ORAL | 0 refills | Status: DC | PRN

## 2017-02-23 MED ORDER — SODIUM CHLORIDE 0.9 % IV SOLN: 500.0000 mL | INTRAVENOUS | Status: DC

## 2017-02-23 NOTE — Op Note (Signed)
[facility] Patient Name: Elizabeth Huerta Procedure Date: 02/23/2017 3:06 PM MRN: [id number] Endoscopist: Gatha Mayer , MD Age: 46 Referring MD:  Date of Birth: [birth year] Gender: Female Account #: [id number] Procedure:                Colonoscopy Indications:              Clinically significant diarrhea of unexplained                            origin Medicines:                Propofol per Anesthesia, Monitored Anesthesia Care Procedure:                Pre-Anesthesia Assessment:                           - Prior to the procedure, a History and Physical                            was performed, and patient medications and                            allergies were reviewed. The patient's tolerance of                            previous anesthesia was also reviewed. The risks                            and benefits of the procedure and the sedation                            options and risks were discussed with the patient.                            All questions were answered, and informed consent                            was obtained. Prior Anticoagulants: The patient has                            taken no previous anticoagulant or antiplatelet                            agents. ASA Grade Assessment: II - A patient with                            mild systemic disease. After reviewing the risks                            and benefits, the patient was deemed in                            satisfactory condition to undergo the procedure.  After obtaining informed consent, the colonoscope                            was passed under direct vision. Throughout the                            procedure, the patient's blood pressure, pulse, and                            oxygen saturations were monitored continuously. The                            Colonoscope was introduced through the anus and                            advanced to the the terminal ileum,  with                            identification of the appendiceal orifice and IC                            valve. The colonoscopy was performed without                            difficulty. The patient tolerated the procedure                            well. The quality of the bowel preparation was                            good. The bowel preparation used was Miralax. The                            terminal ileum, the appendiceal orifice and the                            rectum were photographed. Scope In: 3:16:32 PM Scope Out: 3:27:24 PM Scope Withdrawal Time: 0 hours 7 minutes 37 seconds  Total Procedure Duration: 0 hours 10 minutes 52 seconds  Findings:                 The perianal and digital rectal examinations were                            normal.                           The terminal ileum appeared normal.                           Inflammation characterized by erosions, erythema,                            friability, granularity, loss of vascularity,  aphthous ulcerations and confluent ulcerations was                            found in a continuous and circumferential pattern                            from the sigmoid colon to the cecum and as patches                            surrounded by normal mucosa in the rectum and in                            the sigmoid colon. No sites were spared. This was                            moderate in severity. Biopsies were taken with a                            cold forceps for histology. Verification of patient                            identification for the specimen was done. Estimated                            blood loss was minimal.                           The retroflexed view of the distal rectum and anal                            verge was normal and showed no anal or rectal                            abnormalities. Complications:            No immediate complications. Estimated Blood  Loss:     Estimated blood loss was minimal. Impression:               - The examined portion of the ileum was normal.                           - Pancolitis ulcerative colitis. Inflammation was                            found from the sigmoid colon to the cecum, in the                            rectum and in the sigmoid colon. This was moderate                            in severity. Biopsied.                           - The distal rectum and  anal verge are normal on                            retroflexion view. Recommendation:           - Patient has a contact number available for                            emergencies. The signs and symptoms of potential                            delayed complications were discussed with the                            patient. Return to normal activities tomorrow.                            Written discharge instructions were provided to the                            patient.                           - Resume previous diet.                           - Continue present medications.                           - Repeat colonoscopy is recommended. The                            colonoscopy date will be determined after pathology                            results from today's exam become available for                            review.                           - Biopsies sent rush - anticipate starting steroids                            ? budesonide vs prednisone - will discuss after bxs                            return. she is heading to [place] next week.                           Given increased risk DVT in IBD I may Rx                            anti-coagulant for flights.                           Lomotil  Rx prn today and use if Imodium not helpful Gatha Mayer, MD 02/23/2017 3:36:27 PM This report has been signed electronically.

## 2017-02-23 NOTE — Progress Notes (Signed)
Called to room to assist during endoscopic procedure.  Patient ID and intended procedure confirmed with present staff. Received instructions for my participation in the procedure from the performing physician.  Per Dr. Carlean Purl send path rush.  Gilford Rile will be notified. maw

## 2017-02-23 NOTE — Patient Instructions (Addendum)
   It looks like you have ulcerative colitis. I will contact you soon about biopsy results and treatment.  I am prescribing Lomotil (diphenoxylate and atropine) today - symptomatic treatment for diarrhea.  Try Imodium AD  First but want you to have this stronger anti-diarrheal also.  I appreciate the opportunity to care for you. Gatha Mayer, MD, [facility]  **Handouts given on Low fiber diet and Ulcerative Colitis** YOU HAD AN ENDOSCOPIC PROCEDURE TODAY: Refer to the procedure report and other information in the discharge instructions given to you for any specific questions about what was found during the examination. If this information does not answer your questions, please call [place] office at [phone] to clarify.   YOU SHOULD EXPECT: Some feelings of bloating in the abdomen. Passage of more gas than usual. Walking can help get rid of the air that was put into your GI tract during the procedure and reduce the bloating. If you had a lower endoscopy (such as a colonoscopy or flexible sigmoidoscopy) you may notice spotting of blood in your stool or on the toilet paper. Some abdominal soreness may be present for a day or two, also.  DIET: Your first meal following the procedure should be a light meal and then it is ok to progress to your normal diet. A half-sandwich or bowl of soup is an example of a good first meal. Heavy or fried foods are harder to digest and may make you feel nauseous or bloated. Drink plenty of fluids but you should avoid alcoholic beverages for 24 hours. If you had a esophageal dilation, please see attached instructions for diet.    ACTIVITY: Your care partner should take you home directly after the procedure. You should plan to take it easy, moving slowly for the rest of the day. You can resume normal activity the day after the procedure however YOU SHOULD NOT DRIVE, use power tools, machinery or perform tasks that involve climbing or major physical exertion for 24  hours (because of the sedation medicines used during the test).   SYMPTOMS TO REPORT IMMEDIATELY: A gastroenterologist can be reached at any hour. Please call [phone]  for any of the following symptoms:  Following lower endoscopy (colonoscopy, flexible sigmoidoscopy) Excessive amounts of blood in the stool  Significant tenderness, worsening of abdominal pains  Swelling of the abdomen that is new, acute  Fever of 100 or higher    FOLLOW UP:  If any biopsies were taken you will be contacted by phone or by letter within the next 1-3 weeks. Call [phone]  if you have not heard about the biopsies in 3 weeks.  Please also call with any specific questions about appointments or follow up tests.

## 2017-02-23 NOTE — Progress Notes (Signed)
Alert and oriented x3, pleased with MAC, report to RN Megan 

## 2017-02-24 ENCOUNTER — Telehealth: Payer: Self-pay | Admitting: *Deleted

## 2017-02-24 NOTE — Telephone Encounter (Signed)
  Follow up Call-  Call back number 02/23/2017  Post procedure Call Back phone  # [phone]  Permission to leave phone message Yes  Some recent data might be hidden     Patient questions:  Do you have a fever, pain , or abdominal swelling? No. Pain Score  0 *  Have you tolerated food without any problems? Yes.    Have you been able to return to your normal activities? Yes.    Do you have any questions about your discharge instructions: Diet   No. Medications  No. Follow up visit  No.  Do you have questions or concerns about your Care? No.  Actions: * If pain score is 4 or above: No action needed, pain <4.

## 2017-02-24 NOTE — Telephone Encounter (Signed)
  Follow up Call-  Call back number 02/23/2017  Post procedure Call Back phone  # [phone]  Permission to leave phone message Yes  Some recent data might be hidden     Patient questions:  Message left to call if necessary.

## 2017-02-25 ENCOUNTER — Encounter: Payer: Self-pay | Admitting: Internal Medicine

## 2017-02-25 ENCOUNTER — Other Ambulatory Visit: Payer: Self-pay

## 2017-02-25 DIAGNOSIS — K5100 Ulcerative (chronic) pancolitis without complications: Secondary | ICD-10-CM

## 2017-02-25 HISTORY — DX: Ulcerative (chronic) pancolitis without complications: K51.00

## 2017-02-25 MED ORDER — PREDNISONE 10 MG PO TABS: ORAL_TABLET | ORAL | 0 refills | Status: DC

## 2017-02-25 MED ORDER — MESALAMINE 1.2 G PO TBEC: 2.4000 g | DELAYED_RELEASE_TABLET | Freq: Every day | ORAL | 6 refills | Status: DC

## 2017-02-25 MED ORDER — RIVAROXABAN 10 MG PO TABS: ORAL_TABLET | ORAL | 0 refills | Status: DC

## 2017-02-25 NOTE — Progress Notes (Signed)
Biopsies are consistent with ulcerative colitis impression.  1) Start prednisone 10 mg tabs and take 4 a day for 3 days then 3 a day for 3 days and then 2 each day disp # 100 refill x 1 just stay take 4 each day then as directed 2) Xarelto 10 mg take day of and day after flying # 4 no refills (may not be covered but worth buying in my opinion 3) She is flying to [place] Monday - give her my cell # [phone] and tell her text me before, during after with any ? Or to use My Chart  4) I want her to start Lialda 1.2 g take 2 each day when she gets back and to call me with an update and prednisone taper instructions 5) set up a next available REV 6) No letter from [facility] and no recall at this time 7) Enjoy the wine!

## 2017-03-12 ENCOUNTER — Telehealth: Payer: Self-pay | Admitting: Internal Medicine

## 2017-03-12 NOTE — Telephone Encounter (Signed)
Patient notified to start lialda and call next week with an update. See instructions on pathology from 02/23/17.  She will call back next week

## 2017-03-12 NOTE — Telephone Encounter (Signed)
Left message for patient to call back  

## 2017-03-31 ENCOUNTER — Telehealth: Payer: Self-pay | Admitting: Internal Medicine

## 2017-03-31 NOTE — Telephone Encounter (Signed)
The pt is currently on 2 pills daily of prednisone and 2 pills of lialda and wants to know when she can stop prednisone.  She states she feels really well.  Please advise

## 2017-03-31 NOTE — Telephone Encounter (Signed)
The pt has been given the prednisone taper and will call if she has any problems

## 2017-03-31 NOTE — Telephone Encounter (Signed)
Take 10 mg qd x 7 days then 1/2 pill 5 mg qd x 7 d and stop

## 2017-03-31 NOTE — Telephone Encounter (Signed)
Left message on machine to call back  

## 2017-04-12 ENCOUNTER — Encounter: Payer: Self-pay | Admitting: Gastroenterology

## 2017-04-20 ENCOUNTER — Encounter: Payer: Self-pay | Admitting: Internal Medicine

## 2017-04-20 ENCOUNTER — Ambulatory Visit (INDEPENDENT_AMBULATORY_CARE_PROVIDER_SITE_OTHER): Payer: Federal, State, Local not specified - PPO | Admitting: Internal Medicine

## 2017-04-20 ENCOUNTER — Telehealth: Payer: Self-pay

## 2017-04-20 DIAGNOSIS — K5100 Ulcerative (chronic) pancolitis without complications: Secondary | ICD-10-CM

## 2017-04-20 NOTE — Telephone Encounter (Signed)
-----   Message from Gatha Mayer, MD sent at 04/20/2017  5:05 PM EDT ----- Regarding: Needs labs Get a chance tell or I saw her labs from urology and there weren't any in their since 2017. They were all okay but we need to do some more.  Using universal ulcerative colitis diagnosis please have her get  CBC CMET Kyung Rudd D level

## 2017-04-20 NOTE — Assessment & Plan Note (Addendum)
Asymptompatic at this tme Reviewed the nature and the chronicity of the illness and the requirement for chronic therapy. We discussed the rare possible side effects of mesalamine and include interstitial nephritis kidney damage, lung problems, arthralgias and diarrhea. She will return to clinic in about 6 mos I will review labs from urology to see what she's had done in the past year. She may need some baseline labs. We talked about their expert recommendations to check vitamin D and immunize and she prefers to hold off on that at this point. Hopefully she'll have a mild course but time will tell. Okay to liberalize diet. [employer] membership provided to the patient for 1 year and advised that she use that website as a resource. We discussed how diet has an impact on symptoms but not necessarily on the disease itself. I warned against using nonselective N Saints. Anticipate colonoscopy at 1-2 years after diagnosis to check for endoscopic remission most likely.

## 2017-04-20 NOTE — Patient Instructions (Addendum)
We will obtain your records and labs from [facility] for the past 2 years for Dr Carlean Purl to review.   We are giving you a application to the [employer] for a complimentary one year membership.   Call in January 2019 for a March appointment.     I appreciate the opportunity to care for you. Silvano Rusk, MD, [facility]

## 2017-04-20 NOTE — Telephone Encounter (Signed)
Spoke with [place] and she was sure she had labs done this past Spring. She is going to double check with [facility] and if not done she will come by our lab and have them drawn.

## 2017-04-20 NOTE — Progress Notes (Signed)
LAGINA READER 46 y.o. 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 We discussed the rare possible side effects of mesalamine and include interstitial nephritis kidney damage, lung problems, arthralgias and diarrhea. She will return to clinic in about 6 mos I will review labs from urology to see what she's had done in the past year. She may need some baseline labs. We talked about their expert recommendations to check vitamin D and immunize and she prefers to hold off on that at this point. Hopefully she'll have a mild course but time will tell. Okay to liberalize diet. [employer] membership provided to the patient for 1 year and advised that she use that website as a resource. We discussed how diet has an impact on symptoms but not necessarily on the disease itself. I warned against using nonselective N Saints. Anticipate colonoscopy at 1-2 years after diagnosis to check for endoscopic remission most likely.   Labs returned after the patient left the office. Urology notes reviewed. The last time she had urine chemistries was in 2017. No liver tests. I will have her do a comprehensive metabolic panel and a CBC. Will check a vitamin D level also.  I appreciate the opportunity to care for this patient. CC: Eloise Levels, NP  Subjective:   Chief Complaint: Follow-up of ulcerative colitis  HPI Patient is here after I diagnosed Universal ulcerative colitis a colonoscopy in July of this year. She was treated with prednisone on a tapering basis and is on Lialda 2.4 g daily without symptoms. No pain diarrhea bleeding. She reports that she has had labs in the spring at urology but is not sure which ones she has had drawn. Otherwise she is feeling well she is ready to liberalize her diet some. Allergies  Allergen Reactions  . Adhesive [Tape]  Hives  . Latex Rash   Current Meds  Medication Sig  . cetirizine (ZYRTEC) 10 MG tablet Take 10 mg by mouth at bedtime.   . diphenoxylate-atropine (LOMOTIL) 2.5-0.025 MG tablet Take 1 tablet by mouth 4 (four) times daily as needed for diarrhea or loose stools.  . indapamide (LOZOL) 2.5 MG tablet Take 1 tablet by mouth daily.  . Melatonin 3 MG TABS Take 3 mg by mouth at bedtime.   . mesalamine (LIALDA) 1.2 g EC tablet Take 2 tablets (2.4 g total) by mouth daily with breakfast.  . norethindrone (MICRONOR,CAMILA,ERRIN) 0.35 MG tablet Take 1 tablet (0.35 mg total) by mouth daily.  . ondansetron (ZOFRAN) 4 MG tablet Take 1 tablet (4 mg total) by mouth every 6 (six) hours as needed for nausea or vomiting.  . saccharomyces boulardii (FLORASTOR) 250 MG capsule Take 250 mg by mouth 2 (two) times daily as needed for other. When on antibiotics   . [DISCONTINUED] Probiotic Product (DIGESTIVE ADVANTAGE) CAPS Take 1 capsule by mouth 2 (two) times daily.   Past Medical History:  Diagnosis Date  . Anxiety 2003   treated  . Basal cell carcinoma    nose  . C. difficile colitis 06/20/2015  . Eczema   . History of basal cell carcinoma excision    2010-- nasal area  . Hypertension   . Nephrolithiasis    LEFT  . Renal cyst, right   . Right ureteral stone   . 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  CESAREAN SECTION  2001  &  2003  . COLONOSCOPY  02/2017   ulcerative colitis  . CYSTOSCOPY/RETROGRADE/URETEROSCOPY Right 09/28/2014   Procedure: CYSTOSCOPY RIGHT RETROGRADE RIGHT URETEROSCOPY;  Surgeon: Alexis Frock, MD;  Location[facility];  Service: Urology;  Laterality: Right;  . EXTRACORPOREAL SHOCK WAVE LITHOTRIPSY Bilateral left 05-15-2010/   right 07-03-2010  . HOLMIUM LASER APPLICATION Right 09/13/7865   Procedure: HOLMIUM LASER OF STONES ;  Surgeon: Alexis Frock, MD;  Location: [facility];  Service:  Urology;  Laterality: Right;  . MOHS SURGERY  2010   nasal area   Social History   Social History  . Marital status: Married    Spouse name: N/A  . Number of children: 2  . Years of education: 16   Occupational History  .  Cost Analyst    Social History Main Topics  . Smoking status: Former Smoker    Years: 2.00    Types: Cigarettes  . Smokeless tobacco: Never Used     Comment: minimal in college may 5-6 per year   . Alcohol use 1.2 - 1.8 oz/week    2 - 3 Standard drinks or equivalent per week  . Drug use: No  . Sexual activity: Yes    Partners: Male    Birth control/ protection: Other-see comments     Comment: Husband had vasectomy    Social History Narrative   Married, 2 kids   Cost analyst   Husband [name] - also patient of Dr. Carlean Purl   family history includes Asthma in her mother; COPD in her mother; Heart disease in her father; Hypertension in her father and sister; Skin cancer in her father and mother.   Review of Systems As above  Objective:   Physical Exam BP 130/76   Pulse 64   Ht 5\' 3"  (1.6 m)   Wt 177 lb 12.8 oz (80.6 kg)   LMP 03/26/2017   BMI 31.50 kg/m  NAD  15 minutes time spent with patient > half in counseling coordination of care

## 2017-06-03 DIAGNOSIS — F411 Generalized anxiety disorder: Secondary | ICD-10-CM | POA: Diagnosis not present

## 2017-06-30 DIAGNOSIS — K080 Exfoliation of teeth due to systemic causes: Secondary | ICD-10-CM | POA: Diagnosis not present

## 2017-07-26 ENCOUNTER — Other Ambulatory Visit: Payer: Self-pay | Admitting: Obstetrics and Gynecology

## 2017-07-27 NOTE — Telephone Encounter (Signed)
Medication refill request: OCP Last AEX:  08/05/16 BS Next AEX: not scheduled  Last MMG (if hormonal medication request): 09/22/16 BIRADS 1 negative/density b Refill authorized: 08/14/16 #1 w/11 refills; please advise on refills.  Tried calling patient, no answer, Message left for patient to return my call. Needs AEX scheduled

## 2017-07-28 ENCOUNTER — Telehealth: Payer: Self-pay

## 2017-07-28 NOTE — Telephone Encounter (Signed)
RF done for two months.  She does need an AEX for additional refills.        Documentation     You routed conversation to Megan Salon, MD Yesterday (12:48 PM)    Dennis Bast Yesterday (12:44 PM)     Medication refill request: OCP Last AEX:  08/05/16 BS Next AEX: not scheduled  Last MMG (if hormonal medication request): 09/22/16 BIRADS 1 negative/density b Refill authorized: 08/14/16 #1 w/11 refills; please advise on refills.  Tried calling patient, no answer, Message left for patient to return my call. Needs AEX scheduled

## 2017-07-28 NOTE — Telephone Encounter (Signed)
RF done for two months.  She does need an AEX for additional refills.

## 2017-07-28 NOTE — Telephone Encounter (Signed)
Tried calling patient 2nd attempt to schedule AEX. No answer, Message left for patient to return my call.

## 2017-08-05 NOTE — Telephone Encounter (Signed)
3rd attempt to call patient to schedule AEX, no answer, Message left for patient to return my call.

## 2017-08-18 ENCOUNTER — Other Ambulatory Visit: Payer: Self-pay | Admitting: Obstetrics and Gynecology

## 2017-08-18 DIAGNOSIS — Z139 Encounter for screening, unspecified: Secondary | ICD-10-CM

## 2017-08-31 DIAGNOSIS — M25571 Pain in right ankle and joints of right foot: Secondary | ICD-10-CM | POA: Diagnosis not present

## 2017-08-31 DIAGNOSIS — Z79899 Other long term (current) drug therapy: Secondary | ICD-10-CM | POA: Diagnosis not present

## 2017-08-31 DIAGNOSIS — Z01812 Encounter for preprocedural laboratory examination: Secondary | ICD-10-CM | POA: Diagnosis not present

## 2017-09-02 ENCOUNTER — Telehealth: Payer: Self-pay | Admitting: *Deleted

## 2017-09-02 NOTE — Telephone Encounter (Signed)
"  I'm going to be a new patient for Dr. Paulla Dolly.  I want to know how far out he is scheduled for surgery.  I was scheduled previously at another Podiatry office but I didn't have it done."  Dr. Mellody Drown next available is February 12.  "Okay, sounds good.  I'm scheduled to see him next Friday.  Do you all do waiting lists for appointments if there's a cancellation?  I'd like to get in sooner if I can."  Yes, we do have a waiting list.  "Can you transfer me back to the appointment schedulers so I can ask them to put me on the waiting list for Dr. Paulla Dolly.  I'd like to have this procedure done as soon as possible."  I transferred her to [facility].

## 2017-09-10 ENCOUNTER — Ambulatory Visit (INDEPENDENT_AMBULATORY_CARE_PROVIDER_SITE_OTHER): Payer: Federal, State, Local not specified - PPO

## 2017-09-10 ENCOUNTER — Ambulatory Visit: Payer: Federal, State, Local not specified - PPO | Admitting: Podiatry

## 2017-09-10 ENCOUNTER — Encounter: Payer: Self-pay | Admitting: Podiatry

## 2017-09-10 VITALS — BP 118/79 | HR 90 | Resp 16

## 2017-09-10 DIAGNOSIS — M2011 Hallux valgus (acquired), right foot: Secondary | ICD-10-CM | POA: Diagnosis not present

## 2017-09-10 DIAGNOSIS — M2012 Hallux valgus (acquired), left foot: Secondary | ICD-10-CM

## 2017-09-10 DIAGNOSIS — M216X9 Other acquired deformities of unspecified foot: Secondary | ICD-10-CM

## 2017-09-10 NOTE — Progress Notes (Signed)
   Subjective:    Patient ID: Elizabeth Huerta, female    DOB: 1971/07/27, 47 y.o.   MRN: [id number]  HPI    Review of Systems  All other systems reviewed and are negative.      Objective:   Physical Exam        Assessment & Plan:

## 2017-09-10 NOTE — Patient Instructions (Addendum)
Bunion A bunion is a bump on the base of the big toe that forms when the bones of the big toe joint move out of position. Bunions may be small at first, but they often get larger over time. The can make walking painful. What are the causes? A bunion may be caused by:  Wearing narrow or pointed shoes that force the big toe to press against the other toes.  Abnormal foot development that causes the foot to roll inward (pronate).  Changes in the foot that are caused by certain diseases, such as rheumatoid arthritis and polio.  A foot injury.  What increases the risk? The following factors may make you more likely to develop this condition:  Wearing shoes that squeeze the toes together.  Having certain diseases, such as: ? Rheumatoid arthritis. ? Polio. ? Cerebral palsy.  Having family members who have bunions.  Being born with a foot deformity, such as flat feet or low arches.  Doing activities that put a lot of pressure on the feet, such as ballet dancing.  What are the signs or symptoms? The main symptom of a bunion is a noticeable bump on the big toe. Other symptoms may include:  Pain.  Swelling around the big toe.  Redness and inflammation.  Thick or hardened skin on the big toe or between the toes.  Stiffness or loss of motion in the big toe.  Trouble with walking.  How is this diagnosed? A bunion may be diagnosed based on your symptoms, medical history, and activities. You may have tests, such as:  X-rays. These allow your health care provider to check the position of the bones in your foot and look for damage to your joint. They also help your health care provider to determine the severity of your bunion and the best way to treat it.  Joint aspiration. In this test, a sample of fluid is removed from the toe joint. This test, which may be done if you are in a lot of pain, helps to rule out diseases that cause painful swelling of the joints, such as  arthritis.  How is this treated? There is no cure for a bunion, but treatment can help to prevent a bunion from getting worse. Treatment depends on the severity of your symptoms. Your health care provider may recommend:  Wearing shoes that have a wide toe box.  Using bunion pads to cushion the affected area.  Taping your toes together to keep them in a normal position.  Placing a device inside your shoe (orthotics) to help reduce pressure on your toe joint.  Taking medicine to ease pain, inflammation, and swelling.  Applying heat or ice to the affected area.  Doing stretching exercises.  Surgery to remove scar tissue and move the toes back into their normal position. This treatment is rare.  Follow these instructions at home:  Support your toe joint with proper footwear, shoe padding, or taping as told by your health care provider.  Take over-the-counter and prescription medicines only as told by your health care provider.  If directed, apply ice to the injured area: ? Put ice in a plastic bag. ? Place a towel between your skin and the bag. ? Leave the ice on for 20 minutes, 2-3 times per day.  If directed, apply heat to the affected area before you exercise. Use the heat source that your health care provider recommends, such as a moist heat pack or a heating pad. ? Place a towel between your   skin and the heat source. ? Leave the heat on for 20-30 minutes. ? Remove the heat if your skin turns bright red. This is especially important if you are unable to feel pain, heat, or cold. You may have a greater risk of getting burned.  Do exercises as told by your health care provider.  Keep all follow-up visits as told by your health care provider. Contact a health care provider if:  Your symptoms get worse.  Your symptoms do not improve in 2 weeks. Get help right away if:  You have severe pain and trouble with walking. This information is not intended to replace advice given  to you by your health care provider. Make sure you discuss any questions you have with your health care provider. Document Released: 08/03/2005 Document Revised: 01/09/2016 Document Reviewed: 03/03/2015 Elsevier Interactive Patient Education  2018 Elsevier Inc.  Pre-Operative Instructions  Congratulations, you have decided to take an important step towards improving your quality of life.  You can be assured that the doctors and staff at [facility] will be with you every step of the way.  Here are some important things you should know:  1. Plan to be at the surgery center/hospital at least 1 (one) hour prior to your scheduled time, unless otherwise directed by the surgical center/hospital staff.  You must have a responsible adult accompany you, remain during the surgery and drive you home.  Make sure you have directions to the surgical center/hospital to ensure you arrive on time. 2. If you are having surgery at [facility] or [facility] hospitals, you will need a copy of your medical history and physical form from your family physician within one month prior to the date of surgery. We will give you a form for your primary physician to complete.  3. We make every effort to accommodate the date you request for surgery.  However, there are times where surgery dates or times have to be moved.  We will contact you as soon as possible if a change in schedule is required.   4. No aspirin/ibuprofen for one week before surgery.  If you are on aspirin, any non-steroidal anti-inflammatory medications (Mobic, Aleve, Ibuprofen) should not be taken seven (7) days prior to your surgery.  You make take Tylenol for pain prior to surgery.  5. Medications - If you are taking daily heart and blood pressure medications, seizure, reflux, allergy, asthma, anxiety, pain or diabetes medications, make sure you notify the surgery center/hospital before the day of surgery so they can tell you which medications you should  take or avoid the day of surgery. 6. No food or drink after midnight the night before surgery unless directed otherwise by surgical center/hospital staff. 7. No alcoholic beverages 24-hours prior to surgery.  No smoking 24-hours prior or 24-hours after surgery. 8. Wear loose pants or shorts. They should be loose enough to fit over bandages, boots, and casts. 9. Don't wear slip-on shoes. Sneakers are preferred. 10. Bring your boot with you to the surgery center/hospital.  Also bring crutches or a walker if your physician has prescribed it for you.  If you do not have this equipment, it will be provided for you after surgery. 11. If you have not been contacted by the surgery center/hospital by the day before your surgery, call to confirm the date and time of your surgery. 12. Leave-time from work may vary depending on the type of surgery you have.  Appropriate arrangements should be made prior   to surgery with your employer. 13. Prescriptions will be provided immediately following surgery by your doctor.  Fill these as soon as possible after surgery and take the medication as directed. Pain medications will not be refilled on weekends and must be approved by the doctor. 14. Remove nail polish on the operative foot and avoid getting pedicures prior to surgery. 15. Wash the night before surgery.  The night before surgery wash the foot and leg well with water and the antibacterial soap provided. Be sure to pay special attention to beneath the toenails and in between the toes.  Wash for at least three (3) minutes. Rinse thoroughly with water and dry well with a towel.  Perform this wash unless told not to do so by your physician.  Enclosed: 1 Ice pack (please put in freezer the night before surgery)   1 Hibiclens skin cleaner   Pre-op instructions  If you have any questions regarding the instructions, please do not hesitate to call our office.  [place]: [address], , [place] [address] --  [phone]  [place]: [address] [place], [place] [address] -- [phone]  [place]: [address]  [place], [place] [address] -- [phone]  [place]: [address], [address], [place],  [address] -- [phone]  Website: [email or web address]  

## 2017-09-11 NOTE — Progress Notes (Signed)
Subjective:   Patient ID: Elizabeth Huerta, female   DOB: 47 y.o.   MRN: [id number]   HPI Patient presents with extreme discomfort in the second metatarsal phalangeal joint that is been going on for a while and long-term bunion deformity right which she states that she has tried to accommodate with shoe gear modifications and if she wears too tight of the shoe gets quite irritated and red.  Patient does not smoke and likes to be active   Review of Systems  All other systems reviewed and are negative.       Objective:  Physical Exam  Constitutional: She appears well-developed and well-nourished.  Cardiovascular: Intact distal pulses.  Pulmonary/Chest: Effort normal.  Musculoskeletal: Normal range of motion.  Neurological: She is alert.  Skin: Skin is warm.  Nursing note and vitals reviewed.   Neurovascular status intact muscle strength adequate range of motion within normal limits with patient found to have exquisite discomfort second MPJ right that she states is been present for around 2 years and significant structural bunion deformity right with redness around the joint surface and inability to wear quite a few different types of shoes.  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  We discussed treatment options and she has in her mind that she wants surgery and is seeing another physician who had recommended this but wants [place] to be able to take care of this.  I have recommended a distal osteotomy along with a shortening osteotomy second right since she is already met with another doctor she wants to do consent form today and she is already scheduled on the surgical schedule.   I allowed her to read consent form going over alternative treatments and complications and explained everything is outlined and the fact that there is no long-term guarantees as far success and there is no guarantees will be able to alleviate the discomfort she has in her forefoot.  She wants surgery understanding risk and understands also recovery can take 6 months to 1 year and signed consent form after review.  I did dispense air fracture walker for the postoperative.  And I did encourage her to call with any questions prior to procedure  X-rays indicate significant elevation of the intermetatarsal angle right over left with no apparent arthritis of a bony nature of the second MPJ right

## 2017-09-21 ENCOUNTER — Other Ambulatory Visit: Payer: Self-pay | Admitting: Obstetrics & Gynecology

## 2017-09-22 NOTE — Telephone Encounter (Signed)
Medication refill request: OCP Last AEX:  08-05-16  Next AEX: 11-01-17  Last MMG (if hormonal medication request): 09-22-16 WNL  Refill authorized: please advise

## 2017-09-23 ENCOUNTER — Ambulatory Visit
Admission: RE | Admit: 2017-09-23 | Discharge: 2017-09-23 | Disposition: A | Discharge status: Home / Self Care | Payer: Federal, State, Local not specified - PPO | Source: Ambulatory Visit | Attending: Obstetrics and Gynecology | Admitting: Obstetrics and Gynecology

## 2017-09-23 DIAGNOSIS — Z139 Encounter for screening, unspecified: Secondary | ICD-10-CM

## 2017-09-23 DIAGNOSIS — Z1231 Encounter for screening mammogram for malignant neoplasm of breast: Secondary | ICD-10-CM | POA: Diagnosis not present

## 2017-09-28 ENCOUNTER — Encounter: Payer: Self-pay | Admitting: Podiatry

## 2017-09-28 DIAGNOSIS — M2011 Hallux valgus (acquired), right foot: Secondary | ICD-10-CM | POA: Diagnosis not present

## 2017-09-28 DIAGNOSIS — N200 Calculus of kidney: Secondary | ICD-10-CM | POA: Diagnosis not present

## 2017-09-28 DIAGNOSIS — M21541 Acquired clubfoot, right foot: Secondary | ICD-10-CM | POA: Diagnosis not present

## 2017-09-28 DIAGNOSIS — M216X1 Other acquired deformities of right foot: Secondary | ICD-10-CM | POA: Diagnosis not present

## 2017-09-28 HISTORY — PX: BUNIONECTOMY WITH TARSAL MEDITARSAL FUSION: SHX5602

## 2017-09-30 ENCOUNTER — Telehealth: Payer: Self-pay | Admitting: *Deleted

## 2017-09-30 ENCOUNTER — Encounter: Payer: Self-pay | Admitting: *Deleted

## 2017-09-30 ENCOUNTER — Telehealth: Payer: Self-pay | Admitting: Podiatry

## 2017-09-30 NOTE — Telephone Encounter (Signed)
Called and spoke with patient and the patient stated that she was doing ok and patient quit taken the medicine that was given and is using tylenol and patient is also elevating and icing the surgery foot and I stated if any concerns or questions to call the [place] office. Lattie Haw

## 2017-09-30 NOTE — Telephone Encounter (Signed)
Dory Larsen - Assistant office manager emailed to pt.

## 2017-09-30 NOTE — Telephone Encounter (Signed)
I informed pt Dr. Paulla Dolly would write the letter to excuse her from jury duty.

## 2017-09-30 NOTE — Telephone Encounter (Signed)
I had foot surgery with Dr. Paulla Dolly on Tuesday 12 February. I had a jury summons for today, 14 February and I need to send in documentation that I will not be able to attend. They won't take my word for it. I was hoping you could e-mail me documentation of my surgery and that I was issued pain medication so that I can return that with the jury summons paperwork. My e-mail address is [email or web address] If you would please give me a call back to let me know if this is something you can do so I can return that documentation I would appreciate it. My number is [phone]. Thanks and have a good day.

## 2017-10-06 ENCOUNTER — Ambulatory Visit (INDEPENDENT_AMBULATORY_CARE_PROVIDER_SITE_OTHER): Payer: Federal, State, Local not specified - PPO

## 2017-10-06 ENCOUNTER — Ambulatory Visit (INDEPENDENT_AMBULATORY_CARE_PROVIDER_SITE_OTHER): Payer: Federal, State, Local not specified - PPO | Admitting: Podiatry

## 2017-10-06 VITALS — BP 138/82 | HR 78 | Temp 96.1°F

## 2017-10-06 DIAGNOSIS — M2011 Hallux valgus (acquired), right foot: Secondary | ICD-10-CM

## 2017-10-06 DIAGNOSIS — M216X9 Other acquired deformities of unspecified foot: Secondary | ICD-10-CM

## 2017-10-06 DIAGNOSIS — M2012 Hallux valgus (acquired), left foot: Secondary | ICD-10-CM | POA: Diagnosis not present

## 2017-10-06 NOTE — Progress Notes (Signed)
Subjective:   Patient ID: Elizabeth Huerta, female   DOB: 47 y.o.   MRN: [id number]   HPI Patient states doing really well with surgery with minimal discomfort and mild swelling noted   ROS      Objective:  Physical Exam  Neurovascular status intact negative Homans sign was noted with patient's right foot healing well with wound edges well coapted first and second metatarsal in good alignment with good range of motion but somewhat restricted on the first metatarsal     Assessment:  Doing well postoperatively with moderate restriction of motion first MPJ with patient being timid about movement     Plan:  H&P condition reviewed and recommended range of motion exercises for this patient and I am sending to physical therapy to try to increase motion reduce edema.  Reviewed x-rays and reappoint in 3 weeks to recheck or earlier if needed  X-rays indicate osteotomies are healing well with fixation in place joint congruence with good alignment noted

## 2017-10-07 DIAGNOSIS — M79674 Pain in right toe(s): Secondary | ICD-10-CM | POA: Diagnosis not present

## 2017-10-07 DIAGNOSIS — M25674 Stiffness of right foot, not elsewhere classified: Secondary | ICD-10-CM | POA: Diagnosis not present

## 2017-10-07 DIAGNOSIS — R269 Unspecified abnormalities of gait and mobility: Secondary | ICD-10-CM | POA: Diagnosis not present

## 2017-10-07 DIAGNOSIS — M25474 Effusion, right foot: Secondary | ICD-10-CM | POA: Diagnosis not present

## 2017-10-11 DIAGNOSIS — M25474 Effusion, right foot: Secondary | ICD-10-CM | POA: Diagnosis not present

## 2017-10-11 DIAGNOSIS — R269 Unspecified abnormalities of gait and mobility: Secondary | ICD-10-CM | POA: Diagnosis not present

## 2017-10-11 DIAGNOSIS — M79674 Pain in right toe(s): Secondary | ICD-10-CM | POA: Diagnosis not present

## 2017-10-11 DIAGNOSIS — M25674 Stiffness of right foot, not elsewhere classified: Secondary | ICD-10-CM | POA: Diagnosis not present

## 2017-10-15 DIAGNOSIS — M25474 Effusion, right foot: Secondary | ICD-10-CM | POA: Diagnosis not present

## 2017-10-15 DIAGNOSIS — M25674 Stiffness of right foot, not elsewhere classified: Secondary | ICD-10-CM | POA: Diagnosis not present

## 2017-10-15 DIAGNOSIS — R269 Unspecified abnormalities of gait and mobility: Secondary | ICD-10-CM | POA: Diagnosis not present

## 2017-10-15 DIAGNOSIS — M79674 Pain in right toe(s): Secondary | ICD-10-CM | POA: Diagnosis not present

## 2017-10-18 ENCOUNTER — Telehealth: Payer: Self-pay | Admitting: Podiatry

## 2017-10-18 ENCOUNTER — Ambulatory Visit (INDEPENDENT_AMBULATORY_CARE_PROVIDER_SITE_OTHER): Payer: Self-pay | Admitting: Podiatry

## 2017-10-18 ENCOUNTER — Ambulatory Visit (INDEPENDENT_AMBULATORY_CARE_PROVIDER_SITE_OTHER): Payer: Federal, State, Local not specified - PPO

## 2017-10-18 VITALS — Temp 98.8°F

## 2017-10-18 DIAGNOSIS — M216X9 Other acquired deformities of unspecified foot: Secondary | ICD-10-CM

## 2017-10-18 DIAGNOSIS — M2012 Hallux valgus (acquired), left foot: Secondary | ICD-10-CM

## 2017-10-18 DIAGNOSIS — M2011 Hallux valgus (acquired), right foot: Secondary | ICD-10-CM

## 2017-10-18 DIAGNOSIS — Z9889 Other specified postprocedural states: Secondary | ICD-10-CM

## 2017-10-18 MED ORDER — CEPHALEXIN 500 MG PO CAPS: 500.0000 mg | ORAL_CAPSULE | Freq: Three times a day (TID) | ORAL | 0 refills | Status: DC

## 2017-10-18 NOTE — Progress Notes (Signed)
Subjective: Elizabeth Huerta is a 47 y.o. is seen today in office s/p [place] bunionectomy and second metatarsal osteotomy preformed by Dr. Paulla Dolly They state their pain is mild but she noticed over the weekend the pain is felt tight and there was some swelling.  Today she notes there is redness on the incision there was infection and she presents today as an add-on appointment.  She did not is any drainage or pus or any opening of the incision but has been swollen and red.  She has been going to physical therapy as well.  She is remained in the surgical shoe.  Denies any systemic complaints such as fevers, chills, nausea, vomiting. No calf pain, chest pain, shortness of breath.   Objective: General: No acute distress, AAOx3  DP/PT pulses palpable 2/4, CRT < 3 sec to all digits.  Protective sensation intact. Motor function intact.  RIGHT foot: Incision is well coapted without any evidence of dehiscence.  There is moderate edema to the right foot and there is erythema dressing on the incision and a mild increase in warmth of the foot but there is no ascending cellulitis.  There is no fluctuation or crepitation.  There is no malodor.  The incision overall appears to be coapted and there is no drainage coming from the incision.  There is no surrounding erythema, ascending cellulitis, fluctuance, crepitus, malodor, drainage/purulence. No other areas of tenderness to bilateral lower extremities.  No other open lesions or pre-ulcerative lesions.  No pain with calf compression, swelling, warmth, erythema.       Assessment and Plan:  Status post right foot surgery with increased swelling and erythema along the incision.  -Treatment options discussed including all alternatives, risks, and complications -X-rays were obtained and reviewed.  No evidence of acute osteomyelitis or soft tissue emphysema.  Hardware intact without any complicating factors or acute fracture. -There was some sutures intact mostly to the  second metatarsal osteotomy site.  I was able to trim some of these.  Antibiotic ointment and a bandage was applied.  I want her to keep antibiotic ointment and a bandage on during the day. -Prescribed Keflex. -Ice/elevation -Pain medication as needed. -Monitor for any clinical signs or symptoms of infection and DVT/PE and directed to call the office immediately should any occur or go to the ER. -Follow-up as scheduled with Dr. Paulla Dolly next week or sooner if any problems arise. In the meantime, encouraged to call the office with any questions, concerns, change in symptoms.   Celesta Gentile, DPM

## 2017-10-18 NOTE — Telephone Encounter (Signed)
I informed pt that Gretta Arab, RN would like to see her today, just before her PT appt, and transferred to Schedulers.

## 2017-10-18 NOTE — Telephone Encounter (Signed)
I had surgery with Dr. Paulla Dolly on 12 February. I think I'm getting an infection where the sutures are. It showed up last night. I didn't know if I needed to come in and see him. I have an appointment at 4 pm today with physical therapy in your building. I didn't know if he wanted to see it, if I should put antibiotic ointment on, or if he thinks I need an oral antibiotic. You can reach me at [phone]. Thank you.

## 2017-10-21 DIAGNOSIS — R269 Unspecified abnormalities of gait and mobility: Secondary | ICD-10-CM | POA: Diagnosis not present

## 2017-10-21 DIAGNOSIS — M25674 Stiffness of right foot, not elsewhere classified: Secondary | ICD-10-CM | POA: Diagnosis not present

## 2017-10-21 DIAGNOSIS — M25474 Effusion, right foot: Secondary | ICD-10-CM | POA: Diagnosis not present

## 2017-10-21 DIAGNOSIS — M79674 Pain in right toe(s): Secondary | ICD-10-CM | POA: Diagnosis not present

## 2017-10-26 DIAGNOSIS — M25674 Stiffness of right foot, not elsewhere classified: Secondary | ICD-10-CM | POA: Diagnosis not present

## 2017-10-26 DIAGNOSIS — R269 Unspecified abnormalities of gait and mobility: Secondary | ICD-10-CM | POA: Diagnosis not present

## 2017-10-26 DIAGNOSIS — M25474 Effusion, right foot: Secondary | ICD-10-CM | POA: Diagnosis not present

## 2017-10-26 DIAGNOSIS — M79674 Pain in right toe(s): Secondary | ICD-10-CM | POA: Diagnosis not present

## 2017-10-27 ENCOUNTER — Ambulatory Visit (INDEPENDENT_AMBULATORY_CARE_PROVIDER_SITE_OTHER): Payer: Federal, State, Local not specified - PPO | Admitting: Podiatry

## 2017-10-27 ENCOUNTER — Ambulatory Visit (INDEPENDENT_AMBULATORY_CARE_PROVIDER_SITE_OTHER): Payer: Federal, State, Local not specified - PPO

## 2017-10-27 DIAGNOSIS — M2012 Hallux valgus (acquired), left foot: Secondary | ICD-10-CM

## 2017-10-27 DIAGNOSIS — M2011 Hallux valgus (acquired), right foot: Secondary | ICD-10-CM | POA: Diagnosis not present

## 2017-10-27 DIAGNOSIS — Z9889 Other specified postprocedural states: Secondary | ICD-10-CM

## 2017-10-27 NOTE — Progress Notes (Signed)
Subjective:   Patient ID: Elizabeth Huerta, female   DOB: 47 y.o.   MRN: [id number]   HPI Patient states she is doing really well and she like to return to soft shoe and feels like physical therapy is helping her   ROS      Objective:  Physical Exam  Neurovascular status intact with well-healed surgical site right first and second metatarsal with fixation in place and slight restriction still motion first MPJ with mild edema but it is improving with physical therapy     Assessment:  Doing well overall with mild restriction still noted first MPJ with wound edges well coapted     Plan:  H&P condition reviewed and at this point I went ahead and advised on continued compression elevation and physical therapy.  X-rays indicate that the osteotomies are healing well with good alignment noted and joint congruous

## 2017-10-28 DIAGNOSIS — M79674 Pain in right toe(s): Secondary | ICD-10-CM | POA: Diagnosis not present

## 2017-10-28 DIAGNOSIS — M25674 Stiffness of right foot, not elsewhere classified: Secondary | ICD-10-CM | POA: Diagnosis not present

## 2017-10-28 DIAGNOSIS — M25474 Effusion, right foot: Secondary | ICD-10-CM | POA: Diagnosis not present

## 2017-10-28 DIAGNOSIS — R269 Unspecified abnormalities of gait and mobility: Secondary | ICD-10-CM | POA: Diagnosis not present

## 2017-11-01 ENCOUNTER — Encounter: Payer: Self-pay | Admitting: Obstetrics and Gynecology

## 2017-11-01 ENCOUNTER — Other Ambulatory Visit (HOSPITAL_COMMUNITY)
Admission: RE | Admit: 2017-11-01 | Discharge: 2017-11-01 | Disposition: A | Discharge status: Home / Self Care | Payer: Federal, State, Local not specified - PPO | Source: Ambulatory Visit | Attending: Obstetrics and Gynecology | Admitting: Obstetrics and Gynecology

## 2017-11-01 ENCOUNTER — Ambulatory Visit: Payer: Federal, State, Local not specified - PPO | Admitting: Obstetrics and Gynecology

## 2017-11-01 VITALS — BP 120/80 | HR 82 | Resp 14 | Ht 63.0 in | Wt 176.2 lb

## 2017-11-01 DIAGNOSIS — Z01419 Encounter for gynecological examination (general) (routine) without abnormal findings: Secondary | ICD-10-CM

## 2017-11-01 DIAGNOSIS — N920 Excessive and frequent menstruation with regular cycle: Secondary | ICD-10-CM | POA: Diagnosis not present

## 2017-11-01 MED ORDER — NORETHINDRONE 0.35 MG PO TABS: ORAL_TABLET | ORAL | 11 refills | Status: DC

## 2017-11-01 NOTE — Patient Instructions (Signed)

## 2017-11-01 NOTE — Progress Notes (Signed)
47 y.o. G41P2002 Married Caucasian female here for annual exam.    On progesterone only OCPs.  Still having heavy cycles but they are regular every 21 days and last for 7 days.  Tampon superplus change every 1.5 hours for one day.  Then lighter flow.   Just had foot surgery.   PCP:   No PCP  Patient's last menstrual period was 10/13/2017.           Sexually active: Yes.    The current method of family planning is vasectomy/Norlyda.    Exercising: No.  Exercise is limited by bunionectomy. Smoker:  no  Health Maintenance: Pap:  04/19/13 WNL  History of abnormal Pap:  no MMG:  09/23/17 BIRADS 1 negative/density b TDaP:  unsure Gardasil:   no HIV: possibly done with pregnancy in 2003 Hep C: never Screening Labs:  PCP   reports that she has quit smoking. Her smoking use included cigarettes. She quit after 2.00 years of use. she has never used smokeless tobacco. She reports that she drinks about 1.2 - 1.8 oz of alcohol per week. She reports that she does not use drugs.  Past Medical History:  Diagnosis Date  . Anxiety 2003   treated  . Basal cell carcinoma    nose  . C. difficile colitis 06/20/2015  . Eczema   . History of basal cell carcinoma excision    2010-- nasal area  . Hypertension   . Nephrolithiasis    LEFT  . Renal cyst, right   . Right ureteral stone   . Universal ulcerative colitis ([place]) 02/25/2017    Past Surgical History:  Procedure Laterality Date  . BUNIONECTOMY WITH TARSAL MEDITARSAL FUSION Right 09/28/2017  . CESAREAN SECTION  2001  &  2003  . COLONOSCOPY  02/2017   ulcerative colitis  . CYSTOSCOPY/RETROGRADE/URETEROSCOPY Right 09/28/2014   Procedure: CYSTOSCOPY RIGHT RETROGRADE RIGHT URETEROSCOPY;  Surgeon: Alexis Frock, MD;  Location: [facility];  Service: Urology;  Laterality: Right;  . EXTRACORPOREAL SHOCK WAVE LITHOTRIPSY Bilateral left 05-15-2010/   right 07-03-2010  . HOLMIUM LASER APPLICATION Right 0/35/0093   Procedure: HOLMIUM  LASER OF STONES ;  Surgeon: Alexis Frock, MD;  Location: [facility];  Service: Urology;  Laterality: Right;  . MOHS SURGERY  2010   nasal area    Current Outpatient Medications  Medication Sig Dispense Refill  . cetirizine (ZYRTEC) 10 MG tablet Take 10 mg by mouth at bedtime.     . indapamide (LOZOL) 2.5 MG tablet Take 1 tablet by mouth daily.    . Melatonin 3 MG TABS Take 3 mg by mouth at bedtime.     . mesalamine (LIALDA) 1.2 g EC tablet Take 2 tablets (2.4 g total) by mouth daily with breakfast. 60 tablet 6  . NORLYDA 0.35 MG tablet TAKE 1 TABLET(0.35 MG) BY MOUTH DAILY 28 tablet 1  . saccharomyces boulardii (FLORASTOR) 250 MG capsule Take 250 mg by mouth 2 (two) times daily as needed for other. When on antibiotics      No current facility-administered medications for this visit.     Family History  Problem Relation Age of Onset  . Hypertension Father   . Heart disease Father        stents  . Skin cancer Father   . Hypertension Sister   . Asthma Mother   . COPD Mother   . Skin cancer Mother     ROS:  Pertinent items are noted in HPI.  Otherwise,  a comprehensive ROS was negative.  Exam:   BP 120/80 (BP Location: Right Arm, Patient Position: Sitting, Cuff Size: Normal)   Pulse 82   Resp 14   Ht 5' 3"  (1.6 m)   Wt 176 lb 4 oz (79.9 kg)   LMP 10/13/2017   BMI 31.22 kg/m     General appearance: alert, cooperative and appears stated age Head: Normocephalic, without obvious abnormality, atraumatic Neck: no adenopathy, supple, symmetrical, trachea midline and thyroid normal to inspection and palpation Lungs: clear to auscultation bilaterally Breasts: normal appearance, no masses or tenderness, No nipple retraction or dimpling, No nipple discharge or bleeding, No axillary or supraclavicular adenopathy Heart: regular rate and rhythm Abdomen: soft, non-tender; no masses, no organomegaly Extremities: extremities normal, atraumatic, no cyanosis or edema Skin:  Skin color, texture, turgor normal. No rashes or lesions Lymph nodes: Cervical, supraclavicular, and axillary nodes normal. No abnormal inguinal nodes palpated Neurologic: Grossly normal  Pelvic: External genitalia:  no lesions              Urethra:  normal appearing urethra with no masses, tenderness or lesions              Bartholins and Skenes: normal                 Vagina: normal appearing vagina with normal color and discharge, no lesions              Cervix: no lesions              Pap taken: Yes.   Bimanual Exam:  Uterus:  normal size, contour, position, consistency, mobility, non-tender              Adnexa: no mass, fullness, tenderness              Rectal exam: Yes.  .  Confirms.              Anus:  normal sphincter tone, no lesions  Chaperone was present for exam.  Assessment:   Well woman visit with normal exam. Menorrhagia.  On POPs. Hx C diff colitis. Hx renal stones.  Hx HTN.   Plan: Mammogram screening discussed. Recommended self breast awareness. Pap and HR HPV as above. Guidelines for Calcium, Vitamin D, regular exercise program including cardiovascular and weight bearing exercise. Refill Micronor.  Routine labs.  Return for pelvic US and possible EMB. Follow up annually and prn.   After visit summary provided.

## 2017-11-02 ENCOUNTER — Encounter: Payer: Self-pay | Admitting: Obstetrics and Gynecology

## 2017-11-02 ENCOUNTER — Telehealth: Payer: Self-pay | Admitting: Obstetrics and Gynecology

## 2017-11-02 DIAGNOSIS — M79674 Pain in right toe(s): Secondary | ICD-10-CM | POA: Diagnosis not present

## 2017-11-02 DIAGNOSIS — M25474 Effusion, right foot: Secondary | ICD-10-CM | POA: Diagnosis not present

## 2017-11-02 DIAGNOSIS — M25674 Stiffness of right foot, not elsewhere classified: Secondary | ICD-10-CM | POA: Diagnosis not present

## 2017-11-02 DIAGNOSIS — R269 Unspecified abnormalities of gait and mobility: Secondary | ICD-10-CM | POA: Diagnosis not present

## 2017-11-02 LAB — COMPREHENSIVE METABOLIC PANEL
ALT: 9 IU/L (ref 0–32)
AST: 16 IU/L (ref 0–40)
Albumin/Globulin Ratio: 1.8 (ref 1.2–2.2)
Albumin: 5 g/dL (ref 3.5–5.5)
Alkaline Phosphatase: 78 IU/L (ref 39–117)
BUN/Creatinine Ratio: 12 (ref 9–23)
BUN: 11 mg/dL (ref 6–24)
Bilirubin Total: 0.9 mg/dL (ref 0.0–1.2)
CO2: 24 mmol/L (ref 20–29)
Calcium: 9.5 mg/dL (ref 8.7–10.2)
Chloride: 98 mmol/L (ref 96–106)
Creatinine, Ser: 0.92 mg/dL (ref 0.57–1.00)
GFR calc Af Amer: 85 mL/min/{1.73_m2} (ref >59)
GFR calc non Af Amer: 74 mL/min/{1.73_m2} (ref >59)
Globulin, Total: 2.8 g/dL (ref 1.5–4.5)
Glucose: 93 mg/dL (ref 65–99)
Potassium: 3.1 mmol/L — ABNORMAL LOW (ref 3.5–5.2)
Sodium: 142 mmol/L (ref 134–144)
Total Protein: 7.8 g/dL (ref 6.0–8.5)

## 2017-11-02 LAB — CBC
Hematocrit: 41.7 % (ref 34.0–46.6)
Hemoglobin: 13.7 g/dL (ref 11.1–15.9)
MCH: 28.3 pg (ref 26.6–33.0)
MCHC: 32.9 g/dL (ref 31.5–35.7)
MCV: 86 fL (ref 79–97)
Platelets: 328 10*3/uL (ref 150–379)
RBC: 4.84 x10E6/uL (ref 3.77–5.28)
RDW: 13.9 % (ref 12.3–15.4)
WBC: 9.4 10*3/uL (ref 3.4–10.8)

## 2017-11-02 LAB — VITAMIN D 25 HYDROXY (VIT D DEFICIENCY, FRACTURES): Vit D, 25-Hydroxy: 25.6 ng/mL — ABNORMAL LOW (ref 30.0–100.0)

## 2017-11-02 LAB — LIPID PANEL
Chol/HDL Ratio: 2.8 ratio (ref 0.0–4.4)
Cholesterol, Total: 156 mg/dL (ref 100–199)
HDL: 55 mg/dL (ref >39)
LDL Calculated: 83 mg/dL (ref 0–99)
Triglycerides: 88 mg/dL (ref 0–149)
VLDL Cholesterol Cal: 18 mg/dL (ref 5–40)

## 2017-11-02 LAB — TSH: TSH: 2.4 u[IU]/mL (ref 0.450–4.500)

## 2017-11-02 NOTE — Telephone Encounter (Signed)
Call placed to patient to review benefits for an ultrasound with an endometrial biopsy. Left voicemail message requesting a return call.

## 2017-11-03 LAB — CYTOLOGY - PAP
Diagnosis: NEGATIVE
HPV: NOT DETECTED

## 2017-11-04 DIAGNOSIS — M25474 Effusion, right foot: Secondary | ICD-10-CM | POA: Diagnosis not present

## 2017-11-04 DIAGNOSIS — R269 Unspecified abnormalities of gait and mobility: Secondary | ICD-10-CM | POA: Diagnosis not present

## 2017-11-04 DIAGNOSIS — M25674 Stiffness of right foot, not elsewhere classified: Secondary | ICD-10-CM | POA: Diagnosis not present

## 2017-11-04 DIAGNOSIS — M79674 Pain in right toe(s): Secondary | ICD-10-CM | POA: Diagnosis not present

## 2017-11-04 NOTE — Telephone Encounter (Signed)
Second call placed to patient to review benefits and schedule recommended ultrasound with endometrial biopsy. Left voicemail message requesting a return call

## 2017-11-08 ENCOUNTER — Other Ambulatory Visit: Payer: Self-pay | Admitting: Obstetrics and Gynecology

## 2017-11-08 NOTE — Telephone Encounter (Signed)
Third call placed to patient to review benefits and schedule recommended ultrasound with endometrial biopsy. Left voicemail message requesting a return call.

## 2017-11-10 DIAGNOSIS — M25674 Stiffness of right foot, not elsewhere classified: Secondary | ICD-10-CM | POA: Diagnosis not present

## 2017-11-10 DIAGNOSIS — M25474 Effusion, right foot: Secondary | ICD-10-CM | POA: Diagnosis not present

## 2017-11-10 DIAGNOSIS — M79674 Pain in right toe(s): Secondary | ICD-10-CM | POA: Diagnosis not present

## 2017-11-10 DIAGNOSIS — R269 Unspecified abnormalities of gait and mobility: Secondary | ICD-10-CM | POA: Diagnosis not present

## 2017-11-10 NOTE — Telephone Encounter (Signed)
Call placed to patient to review benefits for recommended ultrasound and endometrial biopsy. Patient understood benefit information presented and is agreeable. Patient had additional questions regarding the endometrial biopsy procedure.  Call transferred to Reesa Chew, RN to answers additional questions.  Routing to [employer], Therapist, sports

## 2017-11-10 NOTE — Telephone Encounter (Signed)
Spoke with patient. All questions answered. PUS and possible EMB scheduled for 4/4/2019n at 10 am with 10:30 consult with Dr.Silva. Patient is agreeable to date and time. Pre procedure instructions given.  Motrin instructions given. Motrin=Advil=Ibuprofen, 800 mg one hour before appointment. Eat a meal and hydrate well before appointment.  Encounter closed.

## 2017-11-12 DIAGNOSIS — M79674 Pain in right toe(s): Secondary | ICD-10-CM | POA: Diagnosis not present

## 2017-11-12 DIAGNOSIS — M25674 Stiffness of right foot, not elsewhere classified: Secondary | ICD-10-CM | POA: Diagnosis not present

## 2017-11-12 DIAGNOSIS — R269 Unspecified abnormalities of gait and mobility: Secondary | ICD-10-CM | POA: Diagnosis not present

## 2017-11-12 DIAGNOSIS — M25474 Effusion, right foot: Secondary | ICD-10-CM | POA: Diagnosis not present

## 2017-11-18 ENCOUNTER — Ambulatory Visit (INDEPENDENT_AMBULATORY_CARE_PROVIDER_SITE_OTHER): Payer: Federal, State, Local not specified - PPO

## 2017-11-18 ENCOUNTER — Ambulatory Visit (INDEPENDENT_AMBULATORY_CARE_PROVIDER_SITE_OTHER): Payer: Federal, State, Local not specified - PPO | Admitting: Obstetrics and Gynecology

## 2017-11-18 ENCOUNTER — Other Ambulatory Visit: Payer: Self-pay

## 2017-11-18 ENCOUNTER — Encounter: Payer: Self-pay | Admitting: Obstetrics and Gynecology

## 2017-11-18 ENCOUNTER — Other Ambulatory Visit: Payer: Federal, State, Local not specified - PPO

## 2017-11-18 DIAGNOSIS — N920 Excessive and frequent menstruation with regular cycle: Secondary | ICD-10-CM

## 2017-11-18 DIAGNOSIS — N858 Other specified noninflammatory disorders of uterus: Secondary | ICD-10-CM | POA: Diagnosis not present

## 2017-11-18 NOTE — Progress Notes (Signed)
Encounter reviewed by Dr. Brook Amundson C. Silva.  

## 2017-11-18 NOTE — Progress Notes (Signed)
GYNECOLOGY  VISIT   HPI: 47 y.o.   Married  Caucasian  female   G2P2002 with Patient's last menstrual period was 11/07/2017.   here for ultrasound.  Heavy cycles on progesterone only OCPs. Tampon superplus change every 1.5 hours for one day during cycle.  No pain with periods.  GYNECOLOGIC HISTORY: Patient's last menstrual period was 11/07/2017. Contraception:  Vasectomy/Norlyda Menopausal hormone therapy:  none Last mammogram:  09/23/17 BIRADS 1 negative/density  Last pap smear:   11/01/17 Pap and HR HPV negative        OB History    Gravida  2   Para  2   Term  2   Preterm      AB      Living  2     SAB      TAB      Ectopic      Multiple      Live Births  2              Patient Active Problem List   Diagnosis Date Noted  . Universal ulcerative colitis ([place]) 02/25/2017    Past Medical History:  Diagnosis Date  . Anxiety 2003   treated  . Basal cell carcinoma    nose  . C. difficile colitis 06/20/2015  . Eczema   . History of basal cell carcinoma excision    2010-- nasal area  . Hypertension   . Hypokalemia   . Low vitamin D level   . Nephrolithiasis    LEFT  . Renal cyst, right   . Right ureteral stone   . Universal ulcerative colitis ([place]) 02/25/2017    Past Surgical History:  Procedure Laterality Date  . BUNIONECTOMY WITH TARSAL MEDITARSAL FUSION Right 09/28/2017  . CESAREAN SECTION  2001  &  2003  . COLONOSCOPY  02/2017   ulcerative colitis  . CYSTOSCOPY/RETROGRADE/URETEROSCOPY Right 09/28/2014   Procedure: CYSTOSCOPY RIGHT RETROGRADE RIGHT URETEROSCOPY;  Surgeon: Alexis Frock, MD;  Location: [facility];  Service: Urology;  Laterality: Right;  . EXTRACORPOREAL SHOCK WAVE LITHOTRIPSY Bilateral left 05-15-2010/   right 07-03-2010  . HOLMIUM LASER APPLICATION Right 4/40/1027   Procedure: HOLMIUM LASER OF STONES ;  Surgeon: Alexis Frock, MD;  Location: [facility];  Service: Urology;  Laterality: Right;   . MOHS SURGERY  2010   nasal area    Current Outpatient Medications  Medication Sig Dispense Refill  . cetirizine (ZYRTEC) 10 MG tablet Take 10 mg by mouth at bedtime.     . indapamide (LOZOL) 2.5 MG tablet Take 1 tablet by mouth daily.    . Melatonin 3 MG TABS Take 3 mg by mouth at bedtime.     . mesalamine (LIALDA) 1.2 g EC tablet Take 2 tablets (2.4 g total) by mouth daily with breakfast. 60 tablet 6  . norethindrone (NORLYDA) 0.35 MG tablet TAKE 1 TABLET(0.35 MG) BY MOUTH DAILY 28 tablet 11  . saccharomyces boulardii (FLORASTOR) 250 MG capsule Take 250 mg by mouth 2 (two) times daily as needed for other. When on antibiotics      No current facility-administered medications for this visit.      ALLERGIES: Adhesive [tape] and Latex  Family History  Problem Relation Age of Onset  . Hypertension Father   . Heart disease Father        stents  . Skin cancer Father   . Hypertension Sister   . Asthma Mother   . COPD Mother   .  Skin cancer Mother     Social History   Socioeconomic History  . Marital status: Married    Spouse name: Not on file  . Number of children: 2  . Years of education: 47  . Highest education level: Not on file  Occupational History    Employer: [employer]  . Financial resource strain: Not on file  . Food insecurity:    Worry: Not on file    Inability: Not on file  . Transportation needs:    Medical: Not on file    Non-medical: Not on file  Tobacco Use  . Smoking status: Former Smoker    Years: 2.00    Types: Cigarettes  . Smokeless tobacco: Never Used  . Tobacco comment: minimal in college may 5-6 per year   Substance and Sexual Activity  . Alcohol use: Yes    Alcohol/week: 1.2 - 1.8 oz    Types: 2 - 3 Standard drinks or equivalent per week  . Drug use: No  . Sexual activity: Yes    Partners: Male    Birth control/protection: Other-see comments    Comment: Husband had vasectomy  Lifestyle  . Physical activity:     Days per week: Not on file    Minutes per session: Not on file  . Stress: Not on file  Relationships  . Social connections:    Talks on phone: Not on file    Gets together: Not on file    Attends religious service: Not on file    Active member of club or organization: Not on file    Attends meetings of clubs or organizations: Not on file    Relationship status: Not on file  . Intimate partner violence:    Fear of current or ex partner: Not on file    Emotionally abused: Not on file    Physically abused: Not on file    Forced sexual activity: Not on file  Other Topics Concern  . 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 Carlean Purl    ROS:  Pertinent items are noted in HPI.  PHYSICAL EXAMINATION:    BP 114/76 (BP Location: Right Arm, Patient Position: Sitting, Cuff Size: Normal)   Pulse 80   Resp 16   Ht 5' 3"  (1.6 m)   Wt 178 lb (80.7 kg)   LMP 11/07/2017   BMI 31.53 kg/m     General appearance: alert, cooperative and appears stated age   Pelvic: External genitalia:  no lesions              Urethra:  normal appearing urethra with no masses, tenderness or lesions              Bartholins and Skenes: normal                 Vagina: normal appearing vagina with normal color and discharge, no lesions              Cervix: no lesions                Bimanual Exam:  Uterus:  normal size, contour, position, consistency, mobility, non-tender              Adnexa: no mass, fullness, tenderness   Pelvic US: No masses.  Possible adenomyosis. EMS 10.23 mm.  Left ovarian follicle.  Normal right ovary.  No  free fluid.  EMB Consent for procedure.  Sterile prep with Hibiclens.  Local 1% lidocaine paracervical block - 10 cc, lot number [id number], exp 01/23. Tenaculum to anterior cervical lip. Pipelle passed to 9 cm x 2.  Tissue to pathology.  No complications.  Minimal EBL.  Chaperone was present for  exam.  ASSESSMENT  Menorrhagia on POPs. Possible adenomyosis by ultrasound but no painful menses.  PLAN  FU EMB.  Instructions/precautions given. Declines Mirena.  OK to continue POPs.   An After Visit Summary was printed and given to the patient.  ___15___ minutes face to face time of which over 50% was spent in counseling.

## 2017-11-18 NOTE — Patient Instructions (Signed)

## 2017-12-01 ENCOUNTER — Ambulatory Visit (INDEPENDENT_AMBULATORY_CARE_PROVIDER_SITE_OTHER): Payer: Federal, State, Local not specified - PPO | Admitting: Podiatry

## 2017-12-01 ENCOUNTER — Encounter: Payer: Self-pay | Admitting: Podiatry

## 2017-12-01 ENCOUNTER — Ambulatory Visit (INDEPENDENT_AMBULATORY_CARE_PROVIDER_SITE_OTHER): Payer: Federal, State, Local not specified - PPO

## 2017-12-01 DIAGNOSIS — M2011 Hallux valgus (acquired), right foot: Secondary | ICD-10-CM

## 2017-12-01 NOTE — Progress Notes (Signed)
Subjective:   Patient ID: Elizabeth Huerta, female   DOB: 47 y.o.   MRN: [id number]   HPI Patient states doing very well with the right foot with minimal discomfort and able to walk distances and is wearing normal shoe gear at this time   ROS      Objective:  Physical Exam  Neurovascular status intact negative Homans sign noted with patient's right foot healing well with wound edges well coapted hallux in rectus position and second metatarsal doing well with minimal discomfort     Assessment:  Doing well post osteotomy right first second metatarsal     Plan:  H&P condition reviewed and recommended the continuation of range of motion exercises and gradual return to normal shoe gear.  Patient will be seen back on an as-needed basis  X-ray indicates the osteotomy is healed well with fixation in place good alignment joint congruous

## 2017-12-31 DIAGNOSIS — H10413 Chronic giant papillary conjunctivitis, bilateral: Secondary | ICD-10-CM | POA: Diagnosis not present

## 2017-12-31 DIAGNOSIS — H2513 Age-related nuclear cataract, bilateral: Secondary | ICD-10-CM | POA: Diagnosis not present

## 2018-01-12 DIAGNOSIS — K080 Exfoliation of teeth due to systemic causes: Secondary | ICD-10-CM | POA: Diagnosis not present

## 2018-03-03 ENCOUNTER — Other Ambulatory Visit: Payer: Self-pay

## 2018-03-03 MED ORDER — MESALAMINE 1.2 G PO TBEC
2.4000 g | DELAYED_RELEASE_TABLET | Freq: Every day | ORAL | 0 refills | Status: DC
Start: 2018-03-03 — End: 2018-03-29

## 2018-03-03 NOTE — Telephone Encounter (Signed)
Patient's mesalamine refilled and noted for her to call us and set up appointment. Seen in 2018.

## 2018-03-29 ENCOUNTER — Other Ambulatory Visit (INDEPENDENT_AMBULATORY_CARE_PROVIDER_SITE_OTHER): Payer: Federal, State, Local not specified - PPO

## 2018-03-29 ENCOUNTER — Encounter: Payer: Self-pay | Admitting: Physician Assistant

## 2018-03-29 ENCOUNTER — Ambulatory Visit: Payer: Federal, State, Local not specified - PPO | Admitting: Physician Assistant

## 2018-03-29 ENCOUNTER — Encounter

## 2018-03-29 VITALS — BP 110/78 | HR 68 | Ht 63.0 in | Wt 177.8 lb

## 2018-03-29 DIAGNOSIS — K5100 Ulcerative (chronic) pancolitis without complications: Secondary | ICD-10-CM

## 2018-03-29 LAB — COMPREHENSIVE METABOLIC PANEL
ALT: 9 U/L (ref 0–35)
AST: 12 U/L (ref 0–37)
Albumin: 4.5 g/dL (ref 3.5–5.2)
Alkaline Phosphatase: 62 U/L (ref 39–117)
BUN: 15 mg/dL (ref 6–23)
CO2: 30 mEq/L (ref 19–32)
Calcium: 9.8 mg/dL (ref 8.4–10.5)
Chloride: 100 mEq/L (ref 96–112)
Creatinine, Ser: 0.82 mg/dL (ref 0.40–1.20)
GFR: 78.85 mL/min (ref >60.00)
Glucose, Bld: 97 mg/dL (ref 70–99)
Potassium: 3.5 mEq/L (ref 3.5–5.1)
Sodium: 136 mEq/L (ref 135–145)
Total Bilirubin: 1.6 mg/dL — ABNORMAL HIGH (ref 0.2–1.2)
Total Protein: 7.7 g/dL (ref 6.0–8.3)

## 2018-03-29 LAB — CBC WITH DIFFERENTIAL/PLATELET
Basophils Absolute: 0.1 10*3/uL (ref 0.0–0.1)
Basophils Relative: 0.7 % (ref 0.0–3.0)
Eosinophils Absolute: 0.2 10*3/uL (ref 0.0–0.7)
Eosinophils Relative: 2.9 % (ref 0.0–5.0)
HCT: 40.3 % (ref 36.0–46.0)
Hemoglobin: 13.6 g/dL (ref 12.0–15.0)
Lymphocytes Relative: 28.8 % (ref 12.0–46.0)
Lymphs Abs: 2.2 10*3/uL (ref 0.7–4.0)
MCHC: 33.7 g/dL (ref 30.0–36.0)
MCV: 84.3 fl (ref 78.0–100.0)
Monocytes Absolute: 0.6 10*3/uL (ref 0.1–1.0)
Monocytes Relative: 8 % (ref 3.0–12.0)
Neutro Abs: 4.6 10*3/uL (ref 1.4–7.7)
Neutrophils Relative %: 59.6 % (ref 43.0–77.0)
Platelets: 372 10*3/uL (ref 150.0–400.0)
RBC: 4.78 Mil/uL (ref 3.87–5.11)
RDW: 14 % (ref 11.5–15.5)
WBC: 7.7 10*3/uL (ref 4.0–10.5)

## 2018-03-29 LAB — VITAMIN D 25 HYDROXY (VIT D DEFICIENCY, FRACTURES): VITD: 16.61 ng/mL — ABNORMAL LOW (ref 30.00–100.00)

## 2018-03-29 MED ORDER — MESALAMINE 1.2 G PO TBEC
2.4000 g | DELAYED_RELEASE_TABLET | Freq: Every day | ORAL | 3 refills | Status: DC
Start: 2018-03-29 — End: 2019-07-28

## 2018-03-29 NOTE — Progress Notes (Addendum)
Chief Complaint: Ulcerative colitis  HPI:    Elizabeth Huerta is a 47 year old female, who follows with Dr. Carlean Purl, with a history of universal ulcerative colitis, who presents to clinic today with a complaint of ulcerative colitis.    04/20/2017 office visit for follow-up of ulcerative colitis.  Previous colonoscopy in July 2018.  Treated with prednisone on a tapering basis and on Lialda 2.4 g daily without symptoms.  At that time it was recommended she follow-up in 6 months.  Patient had a CMP and CBC at that time.  Repeat colonoscopy was recommended 1-2 years from diagnosis.    11/01/2017 CMP with a potassium low at 3.1.  Also a low vitamin D level.  Patient was started on supplementation.    Today, patient explains that in January she stopped her Lialda because she was doing so well and at the end of July she started with "a flare" describing a large amount of loose stools and some nausea as well as occasional vomiting.  Patient decided to restart her Lialda and after 10 days felt better.  She is currently using Lialda 2 tabs daily and feels perfect.  Tells me that "I think I just did not understand I had to be on it all the time".  She is now aware and plans to stay on this medication.    Per previous 42 notes patient was supposed to start on vitamin D due to vitamin D deficiency as well as a potassium supplement.  Patient tells me she has not done either.    Denies fever, chills, blood in her stool, weight loss, anorexia, abdominal pain or symptoms that awaken her night.  Past Medical History:  Diagnosis Date  . Anxiety 2003   treated  . Basal cell carcinoma    nose  . C. difficile colitis 06/20/2015  . Eczema   . History of basal cell carcinoma excision    2010-- nasal area  . Hypertension   . Hypokalemia   . Low vitamin D level   . Nephrolithiasis    LEFT  . Renal cyst, right   . Right ureteral stone   . Universal ulcerative colitis ([place]) 02/25/2017    Past Surgical History:    Procedure Laterality Date  . BUNIONECTOMY WITH TARSAL MEDITARSAL FUSION Right 09/28/2017  . CESAREAN SECTION  2001  &  2003  . COLONOSCOPY  02/2017   ulcerative colitis  . CYSTOSCOPY/RETROGRADE/URETEROSCOPY Right 09/28/2014   Procedure: CYSTOSCOPY RIGHT RETROGRADE RIGHT URETEROSCOPY;  Surgeon: Alexis Frock, MD;  Location: [facility];  Service: Urology;  Laterality: Right;  . EXTRACORPOREAL SHOCK WAVE LITHOTRIPSY Bilateral left 05-15-2010/   right 07-03-2010  . HOLMIUM LASER APPLICATION Right 9/67/5916   Procedure: HOLMIUM LASER OF STONES ;  Surgeon: Alexis Frock, MD;  Location: [facility];  Service: Urology;  Laterality: Right;  . MOHS SURGERY  2010   nasal area    Current Outpatient Medications  Medication Sig Dispense Refill  . cetirizine (ZYRTEC) 10 MG tablet Take 10 mg by mouth at bedtime.     . indapamide (LOZOL) 2.5 MG tablet Take 1 tablet by mouth daily.    . Melatonin 3 MG TABS Take 3 mg by mouth at bedtime.     . mesalamine (LIALDA) 1.2 g EC tablet Take 2 tablets (2.4 g total) by mouth daily with breakfast. 60 tablet 0  . norethindrone (NORLYDA) 0.35 MG tablet TAKE 1 TABLET(0.35 MG) BY MOUTH DAILY 28 tablet 11  . saccharomyces boulardii (  FLORASTOR) 250 MG capsule Take 250 mg by mouth 2 (two) times daily as needed for other. When on antibiotics      No current facility-administered medications for this visit.     Allergies as of 03/29/2018 - Review Complete 12/01/2017  Allergen Reaction Noted  . Adhesive [tape] Hives 08/05/2016  . Latex Rash 09/25/2014    Family History  Problem Relation Age of Onset  . Hypertension Father   . Heart disease Father        stents  . Skin cancer Father   . Hypertension Sister   . Asthma Mother   . COPD Mother   . Skin cancer Mother     Social History   Socioeconomic History  . Marital status: Married    Spouse name: Not on file  . Number of children: 2  . Years of education: 29  . Highest  education level: Not on file  Occupational History    Employer: [employer]  . Financial resource strain: Not on file  . Food insecurity:    Worry: Not on file    Inability: Not on file  . Transportation needs:    Medical: Not on file    Non-medical: Not on file  Tobacco Use  . Smoking status: Former Smoker    Years: 2.00    Types: Cigarettes  . Smokeless tobacco: Never Used  . Tobacco comment: minimal in college may 5-6 per year   Substance and Sexual Activity  . Alcohol use: Yes    Alcohol/week: 2.0 - 3.0 standard drinks    Types: 2 - 3 Standard drinks or equivalent per week  . Drug use: No  . Sexual activity: Yes    Partners: Male    Birth control/protection: Other-see comments    Comment: Husband had vasectomy  Lifestyle  . Physical activity:    Days per week: Not on file    Minutes per session: Not on file  . Stress: Not on file  Relationships  . Social connections:    Talks on phone: Not on file    Gets together: Not on file    Attends religious service: Not on file    Active member of club or organization: Not on file    Attends meetings of clubs or organizations: Not on file    Relationship status: Not on file  . Intimate partner violence:    Fear of current or ex partner: Not on file    Emotionally abused: Not on file    Physically abused: Not on file    Forced sexual activity: Not on file  Other Topics Concern  . 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 Carlean Purl    Review of Systems:    Constitutional: No weight loss, fever or chills Skin: No rash Cardiovascular: No chest pain Respiratory: No SOB  Gastrointestinal: See HPI and otherwise negative Genitourinary: No dysuria  Neurological: No headache, dizziness or syncope Musculoskeletal: No new muscle or joint pain Hematologic: No bleeding  Psychiatric: No history of depression or anxiety   Physical Exam:  Vital  signs: BP 110/78   Pulse 68   Ht 5\' 3"  (1.6 m)   Wt 177 lb 12.8 oz (80.6 kg)   LMP 03/13/2018 (Exact Date)   BMI 31.50 kg/m   Constitutional:   Pleasant Caucasian female appears to be in NAD, Well developed, Well nourished,  alert and cooperative Head:  Normocephalic and atraumatic. Eyes:   PEERL, EOMI. No icterus. Conjunctiva pink. Ears:  Normal auditory acuity. Neck:  Supple Throat: Oral cavity and pharynx without inflammation, swelling or lesion.  Respiratory: Respirations even and unlabored. Lungs clear to auscultation bilaterally.   No wheezes, crackles, or rhonchi.  Cardiovascular: Normal S1, S2. No MRG. Regular rate and rhythm. No peripheral edema, cyanosis or pallor.  Gastrointestinal:  Soft, nondistended, nontender. No rebound or guarding. Normal bowel sounds. No appreciable masses or hepatomegaly. Rectal:  Not performed.  Msk:  Symmetrical without gross deformities. Without edema, no deformity or joint abnormality.  Neurologic:  Alert and  oriented x4;  grossly normal neurologically.  Skin:   Dry and intact without significant lesions or rashes. Psychiatric: Demonstrates good judgement and reason without abnormal affect or behaviors.  MOST RECENT LABS AND IMAGING: CBC    Component Value Date/Time   WBC 9.4 11/01/2017 1558   WBC 9.3 06/18/2015 1455   RBC 4.84 11/01/2017 1558   RBC 4.94 06/18/2015 1455   HGB 13.7 11/01/2017 1558   HCT 41.7 11/01/2017 1558   PLT 328 11/01/2017 1558   MCV 86 11/01/2017 1558   MCH 28.3 11/01/2017 1558   MCH 28.7 09/21/2014 0213   MCHC 32.9 11/01/2017 1558   MCHC 33.1 06/18/2015 1455   RDW 13.9 11/01/2017 1558   LYMPHSABS 2.3 06/18/2015 1455   MONOABS 0.6 06/18/2015 1455   EOSABS 0.2 06/18/2015 1455   BASOSABS 0.0 06/18/2015 1455    CMP     Component Value Date/Time   NA 142 11/01/2017 1558   K 3.1 (L) 11/01/2017 1558   CL 98 11/01/2017 1558   CO2 24 11/01/2017 1558   GLUCOSE 93 11/01/2017 1558   GLUCOSE 115 (H) 06/18/2015  1455   BUN 11 11/01/2017 1558   CREATININE 0.92 11/01/2017 1558   CALCIUM 9.5 11/01/2017 1558   CALCIUM 9.1 05/15/2010 1630   PROT 7.8 11/01/2017 1558   ALBUMIN 5.0 11/01/2017 1558   AST 16 11/01/2017 1558   ALT 9 11/01/2017 1558   ALKPHOS 78 11/01/2017 1558   BILITOT 0.9 11/01/2017 1558   GFRNONAA 74 11/01/2017 1558   GFRAA 85 11/01/2017 1558    Assessment: 1.  Ulcerative colitis: Diagnosed July 2018, started on Lialda, patient stopped this in January and had a flare again in July, restarted Lialda and is using 1.2 g tabs, 2 tabs daily and doing well  Plan: 1.  Ordered repeat CBC and CMP today as well as vitamin D level. 2.  We will discuss recommendations for repeat colonoscopy timing with Dr. Carlean Purl.  Originally he had recommended 1 to 2 years out from diagnosis.  Patient tells me that it would be more convenient for her to do this in winter of this year and next summer. 3.  Continue Lialda 1.2 g tabs 2 tabs daily.  Refilled this prescription for a year. 4.  Patient to follow in clinic per recommendations after labs above or in 1 year with Dr. Carlean Purl.  Ellouise Newer, PA-C [facility] 03/29/2018, 9:46 AM   Agree with Ms. Mort Sawyers evaluation and management.  I would wait to to do a colonoscopy until she has had at least 6 months of doing well or would do it if I needed to do one to investigate otherwise.  I suggest she be told to call in late October to see me in November or December and we can figure it out then - not urgent.  Ofilia Neas.  Carlean Purl, MD, [facility]

## 2018-03-29 NOTE — Patient Instructions (Signed)
We have sent the following medications to your pharmacy for you to pick up at your convenience: Lialda 2 tablets daily   Your provider has requested that you go to the basement level for lab work before leaving today. Press "B" on the elevator. The lab is located at the first door on the left as you exit the elevator.

## 2018-04-01 DIAGNOSIS — N200 Calculus of kidney: Secondary | ICD-10-CM | POA: Diagnosis not present

## 2018-05-04 DIAGNOSIS — H00012 Hordeolum externum right lower eyelid: Secondary | ICD-10-CM | POA: Diagnosis not present

## 2018-05-04 DIAGNOSIS — Z23 Encounter for immunization: Secondary | ICD-10-CM | POA: Diagnosis not present

## 2018-05-09 DIAGNOSIS — H00012 Hordeolum externum right lower eyelid: Secondary | ICD-10-CM | POA: Diagnosis not present

## 2018-07-27 DIAGNOSIS — K080 Exfoliation of teeth due to systemic causes: Secondary | ICD-10-CM | POA: Diagnosis not present

## 2018-10-03 DIAGNOSIS — F418 Other specified anxiety disorders: Secondary | ICD-10-CM | POA: Diagnosis not present

## 2018-10-06 ENCOUNTER — Other Ambulatory Visit: Payer: Self-pay | Admitting: Obstetrics and Gynecology

## 2018-10-06 DIAGNOSIS — Z1231 Encounter for screening mammogram for malignant neoplasm of breast: Secondary | ICD-10-CM

## 2018-10-06 DIAGNOSIS — D225 Melanocytic nevi of trunk: Secondary | ICD-10-CM | POA: Diagnosis not present

## 2018-10-06 DIAGNOSIS — D485 Neoplasm of uncertain behavior of skin: Secondary | ICD-10-CM | POA: Diagnosis not present

## 2018-10-06 DIAGNOSIS — Z85828 Personal history of other malignant neoplasm of skin: Secondary | ICD-10-CM | POA: Diagnosis not present

## 2018-10-06 DIAGNOSIS — C44311 Basal cell carcinoma of skin of nose: Secondary | ICD-10-CM | POA: Diagnosis not present

## 2018-10-06 DIAGNOSIS — L738 Other specified follicular disorders: Secondary | ICD-10-CM | POA: Diagnosis not present

## 2018-10-06 DIAGNOSIS — C4401 Basal cell carcinoma of skin of lip: Secondary | ICD-10-CM | POA: Diagnosis not present

## 2018-10-11 ENCOUNTER — Other Ambulatory Visit: Payer: Self-pay | Admitting: Obstetrics and Gynecology

## 2018-10-11 NOTE — Telephone Encounter (Signed)
Medication refill request: Norlyda  Last AEX:  11/01/17 Next AEX: 11/28/18 Last MMG (if hormonal medication request): 09/23/17 Bi-rads 1 neg Has MM scheduled for 11/02/18 Refill authorized: #28 with 1 RF

## 2018-10-14 DIAGNOSIS — N200 Calculus of kidney: Secondary | ICD-10-CM | POA: Diagnosis not present

## 2018-10-14 DIAGNOSIS — R1032 Left lower quadrant pain: Secondary | ICD-10-CM | POA: Diagnosis not present

## 2018-11-02 ENCOUNTER — Ambulatory Visit: Payer: Federal, State, Local not specified - PPO

## 2018-11-09 DIAGNOSIS — C4401 Basal cell carcinoma of skin of lip: Secondary | ICD-10-CM | POA: Diagnosis not present

## 2018-11-09 DIAGNOSIS — C44311 Basal cell carcinoma of skin of nose: Secondary | ICD-10-CM | POA: Diagnosis not present

## 2018-11-16 HISTORY — PX: MOHS SURGERY: SUR867

## 2018-11-28 ENCOUNTER — Ambulatory Visit: Payer: Federal, State, Local not specified - PPO | Admitting: Obstetrics and Gynecology

## 2018-12-19 ENCOUNTER — Other Ambulatory Visit: Payer: Self-pay | Admitting: Obstetrics and Gynecology

## 2018-12-20 NOTE — Telephone Encounter (Signed)
Medication refill request: norlyda 0.34m Last AEX:  11-01-17 Next AEX: cancelled due to covid Last MMG (if hormonal medication request): 09-23-17 category b density birads 1:neg Refill authorized: please approve if appropriate.

## 2019-02-11 ENCOUNTER — Other Ambulatory Visit: Payer: Self-pay | Admitting: Obstetrics and Gynecology

## 2019-02-13 NOTE — Telephone Encounter (Signed)
Medication refill request: Elizabeth Huerta  Last AEX:  11-01-17 BS  Next AEX: 02-23-2019 Last MMG (if hormonal medication request): n/a Refill authorized: Today, please advise.   Medication pended for #28, 0RF. Please refill if appropriate.

## 2019-02-14 ENCOUNTER — Telehealth: Payer: Self-pay | Admitting: Internal Medicine

## 2019-02-14 NOTE — Telephone Encounter (Signed)
Please see OV notes from 03/29/18 and advise whether pt can be schedule for direct colonoscopy.

## 2019-02-14 NOTE — Telephone Encounter (Signed)
Patient is doing very well no complaints.  She is due for office visit with Dr. Carlean Purl.  She is asked to call back in 2 weeks and set up an office visit for the August schedule.  We discussed that she and Dr. Carlean Purl can discuss timing of colonoscopy at that appt.

## 2019-02-16 ENCOUNTER — Other Ambulatory Visit: Payer: Self-pay | Admitting: Obstetrics and Gynecology

## 2019-02-23 ENCOUNTER — Ambulatory Visit: Payer: Federal, State, Local not specified - PPO | Admitting: Obstetrics and Gynecology

## 2019-03-14 ENCOUNTER — Other Ambulatory Visit: Payer: Self-pay | Admitting: Obstetrics and Gynecology

## 2019-03-15 NOTE — Telephone Encounter (Signed)
Medication refill request: Norlyda Last AEX:  11/01/17 Next AEX: 04/05/19 Last MMG (if hormonal medication request): 09/23/17 Bi-rads 1 neg  Refill authorized: #28 with 0 refills to get her to her aex

## 2019-04-03 ENCOUNTER — Ambulatory Visit
Admission: RE | Admit: 2019-04-03 | Discharge: 2019-04-03 | Disposition: A | Discharge status: Home / Self Care | Payer: Federal, State, Local not specified - PPO | Source: Ambulatory Visit | Attending: Obstetrics and Gynecology | Admitting: Obstetrics and Gynecology

## 2019-04-03 ENCOUNTER — Other Ambulatory Visit: Payer: Self-pay

## 2019-04-03 DIAGNOSIS — Z1231 Encounter for screening mammogram for malignant neoplasm of breast: Secondary | ICD-10-CM | POA: Diagnosis not present

## 2019-04-04 NOTE — Progress Notes (Signed)
48 y.o. G70P2002 Married Caucasian female here for annual exam.    Menses lasting longer, but light for the last few days.  No pain.  Pelvic US last year showed possible adenomyosis.  EMB- benign proliferative endometrium.   She is worried about having a skin reaction to metal, so she does not want an IUD.   PCP:  None   Patient's last menstrual period was 03/18/2019 (exact date).     Period Cycle (Days): 30 Period Duration (Days): 5-10 days Period Pattern: Regular Menstrual Flow: Heavy(worse bleeding at night) Menstrual Control: Maxi pad, Other (Comment)(super plus tampon) Menstrual Control Change Freq (Hours): every 1-2 hours on heaviest day Dysmenorrhea: None     Sexually active: Yes.    The current method of family planning is vasectomy.   POPs.  Exercising: Yes.    walking 4x/week Smoker:  no  Health Maintenance: Pap: 11-01-17 Neg:Neg HR HPV, 04-19-13 Neg History of abnormal Pap:  no MMG: 04-03-19 3D Neg/density B/BiRads1 Colonoscopy: 02-23-17 normal;to see GI this year BMD:   n/a  Result  n/a TDaP:  ?within 10 years Gardasil:   n/a HIV: with pregnancy 2003 Hep C:unsure Screening Labs:  ---   reports that she has quit smoking. Her smoking use included cigarettes. She quit after 2.00 years of use. She has never used smokeless tobacco. She reports current alcohol use of about 2.0 - 3.0 standard drinks of alcohol per week. She reports that she does not use drugs.  Past Medical History:  Diagnosis Date  . Anxiety 2003   treated  . Basal cell carcinoma    nose  . C. difficile colitis 06/20/2015  . Eczema   . History of basal cell carcinoma excision    2010-- nasal area  . Hypertension   . Hypokalemia   . Low vitamin D level   . Nephrolithiasis    LEFT  . Renal cyst, right   . Right ureteral stone   . Universal ulcerative colitis ([place]) 02/25/2017    Past Surgical History:  Procedure Laterality Date  . BUNIONECTOMY WITH TARSAL MEDITARSAL FUSION Right 09/28/2017  .  CESAREAN SECTION  2001  &  2003  . COLONOSCOPY  02/2017   ulcerative colitis  . CYSTOSCOPY/RETROGRADE/URETEROSCOPY Right 09/28/2014   Procedure: CYSTOSCOPY RIGHT RETROGRADE RIGHT URETEROSCOPY;  Surgeon: Alexis Frock, MD;  Location[facility];  Service: Urology;  Laterality: Right;  . EXTRACORPOREAL SHOCK WAVE LITHOTRIPSY Bilateral left 05-15-2010/   right 07-03-2010  . HOLMIUM LASER APPLICATION Right 3/73/4287   Procedure: HOLMIUM LASER OF STONES ;  Surgeon: Alexis Frock, MD;  Location: [facility];  Service: Urology;  Laterality: Right;  . MOHS SURGERY  2010   nasal area  . MOHS SURGERY  11/2018   nose and upper lip    Current Outpatient Medications  Medication Sig Dispense Refill  . cetirizine (ZYRTEC) 10 MG tablet Take 10 mg by mouth at bedtime.     . indapamide (LOZOL) 2.5 MG tablet Take 1 tablet by mouth daily.    . Melatonin 3 MG TABS Take 3 mg by mouth at bedtime.     . mesalamine (LIALDA) 1.2 g EC tablet Take 2 tablets (2.4 g total) by mouth daily with breakfast. 180 tablet 3  . NORLYDA 0.35 MG tablet TAKE 1 TABLET(0.35 MG) BY MOUTH DAILY 28 tablet 0  . saccharomyces boulardii (FLORASTOR) 250 MG capsule Take 250 mg by mouth 2 (two) times daily as needed for other. When on antibiotics  No current facility-administered medications for this visit.     Family History  Problem Relation Age of Onset  . Hypertension Father   . Heart disease Father        stents  . Skin cancer Father   . Hypertension Sister   . Asthma Mother   . COPD Mother   . Skin cancer Mother     Review of Systems  All other systems reviewed and are negative.   Exam:   BP 140/70 (Cuff Size: Large)   Pulse 76   Temp (!) 97.4 F (36.3 C) (Temporal)   Resp 14   Ht 5' 3"  (1.6 m)   Wt 178 lb 9.6 oz (81 kg)   LMP 03/18/2019 (Exact Date)   BMI 31.64 kg/m     General appearance: alert, cooperative and appears stated age Head: normocephalic, without obvious  abnormality, atraumatic Neck: no adenopathy, supple, symmetrical, trachea midline and thyroid normal to inspection and palpation Lungs: clear to auscultation bilaterally Breasts: normal appearance, no masses or tenderness, No nipple retraction or dimpling, No nipple discharge or bleeding, No axillary adenopathy Heart: regular rate and rhythm Abdomen: soft, non-tender; no masses, no organomegaly Extremities: extremities normal, atraumatic, no cyanosis or edema Skin: skin color, texture, turgor normal. No rashes or lesions Lymph nodes: cervical, supraclavicular, and axillary nodes normal. Neurologic: grossly normal  Pelvic: External genitalia:  no lesions              No abnormal inguinal nodes palpated.              Urethra:  normal appearing urethra with no masses, tenderness or lesions              Bartholins and Skenes: normal                 Vagina: normal appearing vagina with normal color and discharge, no lesions              Cervix: no lesions              Pap taken: No. Bimanual Exam:  Uterus:  normal size, contour, position, consistency, mobility, non-tender              Adnexa: no mass, fullness, tenderness              Rectal exam: Yes.  .  Confirms.              Anus:  normal sphincter tone, no lesions  Chaperone was present for exam.  Assessment:   Well woman visit with normal exam. Menorrhagia. Possible adenomyosis.  On POPs. Hx C diff colitis. Inflammatory bowel disease.  Hx renal stones.  No hx HTN but elevated BP today.   Plan: Mammogram screening discussed. Self breast awareness reviewed. Pap and HR HPV as above. Guidelines for Calcium, Vitamin D, regular exercise program including cardiovascular and weight bearing exercise. Colonoscopy due.  Refill of name brand Micronor for one year. I clarified that Mirena does not contain metal.  Consider Aygestin if patient has difficulty with cycle control on Micronor. Labs with PCP.  Follow up annually and prn.    After visit summary provided.

## 2019-04-05 ENCOUNTER — Ambulatory Visit: Payer: Federal, State, Local not specified - PPO | Admitting: Obstetrics and Gynecology

## 2019-04-05 ENCOUNTER — Encounter: Payer: Self-pay | Admitting: Obstetrics and Gynecology

## 2019-04-05 ENCOUNTER — Other Ambulatory Visit: Payer: Self-pay

## 2019-04-05 VITALS — BP 140/70 | HR 76 | Temp 97.4°F | Resp 14 | Ht 63.0 in | Wt 178.6 lb

## 2019-04-05 DIAGNOSIS — Z01419 Encounter for gynecological examination (general) (routine) without abnormal findings: Secondary | ICD-10-CM

## 2019-04-05 MED ORDER — ORTHO MICRONOR 0.35 MG PO TABS: 1.0000 | ORAL_TABLET | Freq: Every day | ORAL | 3 refills | Status: DC

## 2019-04-05 MED ORDER — NYSTATIN 100000 UNIT/GM EX POWD: Freq: Three times a day (TID) | CUTANEOUS | 2 refills | Status: DC

## 2019-04-05 NOTE — Patient Instructions (Signed)

## 2019-04-09 ENCOUNTER — Other Ambulatory Visit: Payer: Self-pay | Admitting: Obstetrics and Gynecology

## 2019-04-10 NOTE — Telephone Encounter (Signed)
Medication refill request: Norlyda Last AEX:  04/05/2019 Next AEX: 04/07/2019 Last MMG (if hormonal medication request): 04/03/2019 BIRADS 1 Negative, Density B Refill authorized: Pt called and said she would like Norlyda instead of Micronor due to cost if possible. Pending authorization. Please advise.

## 2019-04-10 NOTE — Telephone Encounter (Signed)
Patient would like to cancel prescription for Micronor due to cost and would like to continue taking Norlyda. Patient would like a 90 supply.

## 2019-04-20 DIAGNOSIS — D2239 Melanocytic nevi of other parts of face: Secondary | ICD-10-CM | POA: Diagnosis not present

## 2019-04-20 DIAGNOSIS — Z85828 Personal history of other malignant neoplasm of skin: Secondary | ICD-10-CM | POA: Diagnosis not present

## 2019-04-20 DIAGNOSIS — D225 Melanocytic nevi of trunk: Secondary | ICD-10-CM | POA: Diagnosis not present

## 2019-04-20 DIAGNOSIS — L814 Other melanin hyperpigmentation: Secondary | ICD-10-CM | POA: Diagnosis not present

## 2019-04-20 DIAGNOSIS — D2262 Melanocytic nevi of left upper limb, including shoulder: Secondary | ICD-10-CM | POA: Diagnosis not present

## 2019-05-23 DIAGNOSIS — R310 Gross hematuria: Secondary | ICD-10-CM | POA: Diagnosis not present

## 2019-05-23 DIAGNOSIS — N200 Calculus of kidney: Secondary | ICD-10-CM | POA: Diagnosis not present

## 2019-07-28 ENCOUNTER — Other Ambulatory Visit: Payer: Self-pay

## 2019-07-28 MED ORDER — MESALAMINE 1.2 G PO TBEC: 2.4000 g | DELAYED_RELEASE_TABLET | Freq: Every day | ORAL | 0 refills | Status: DC

## 2019-08-18 DIAGNOSIS — E538 Deficiency of other specified B group vitamins: Secondary | ICD-10-CM

## 2019-08-18 DIAGNOSIS — R7989 Other specified abnormal findings of blood chemistry: Secondary | ICD-10-CM

## 2019-08-18 HISTORY — DX: Other specified abnormal findings of blood chemistry: R79.89

## 2019-08-18 HISTORY — DX: Deficiency of other specified B group vitamins: E53.8

## 2019-09-25 ENCOUNTER — Ambulatory Visit: Payer: Federal, State, Local not specified - PPO | Attending: Internal Medicine

## 2019-09-25 DIAGNOSIS — Z23 Encounter for immunization: Secondary | ICD-10-CM

## 2019-09-25 NOTE — Progress Notes (Signed)
   Covid-19 Vaccination Clinic  Name:  Elizabeth Huerta    MRN: [id number] DOB: Oct 25, 1970  09/25/2019  [name] was observed post Covid-19 immunization for 15 minutes without incidence. She was provided with Vaccine Information Sheet and instruction to access the V-Safe system.   Ms. Level was instructed to call 911 with any severe reactions post vaccine: Marland Kitchen Difficulty breathing  . Swelling of your face and throat  . A fast heartbeat  . A bad rash all over your body  . Dizziness and weakness    Immunizations Administered    Name Date Dose VIS Date Route   Pfizer COVID-19 Vaccine 09/25/2019  6:23 PM 0.3 mL 07/28/2019 Intramuscular   Manufacturer: [place]   Lot: [id number]   [place]: [id number]

## 2019-10-20 ENCOUNTER — Ambulatory Visit: Payer: Federal, State, Local not specified - PPO | Attending: Internal Medicine

## 2019-10-20 DIAGNOSIS — Z23 Encounter for immunization: Secondary | ICD-10-CM

## 2019-10-20 NOTE — Progress Notes (Signed)
   Covid-19 Vaccination Clinic  Name:  Elizabeth Huerta    MRN: [id number] DOB: 1971-01-06  10/20/2019  [name] was observed post Covid-19 immunization for 15 minutes without incident. She was provided with Vaccine Information Sheet and instruction to access the V-Safe system.   [name] was instructed to call 911 with any severe reactions post vaccine: Marland Kitchen Difficulty breathing  . Swelling of face and throat  . A fast heartbeat  . A bad rash all over body  . Dizziness and weakness   Immunizations Administered    Name Date Dose VIS Date Route   Pfizer COVID-19 Vaccine 10/20/2019  3:14 PM 0.3 mL 07/28/2019 Intramuscular   Manufacturer: [place]   Lot: [id number]   [place]: [id number]

## 2019-10-26 DIAGNOSIS — D2262 Melanocytic nevi of left upper limb, including shoulder: Secondary | ICD-10-CM | POA: Diagnosis not present

## 2019-10-26 DIAGNOSIS — D485 Neoplasm of uncertain behavior of skin: Secondary | ICD-10-CM | POA: Diagnosis not present

## 2019-10-26 DIAGNOSIS — D1801 Hemangioma of skin and subcutaneous tissue: Secondary | ICD-10-CM | POA: Diagnosis not present

## 2019-10-26 DIAGNOSIS — D2261 Melanocytic nevi of right upper limb, including shoulder: Secondary | ICD-10-CM | POA: Diagnosis not present

## 2019-10-26 DIAGNOSIS — Z85828 Personal history of other malignant neoplasm of skin: Secondary | ICD-10-CM | POA: Diagnosis not present

## 2019-10-26 DIAGNOSIS — L821 Other seborrheic keratosis: Secondary | ICD-10-CM | POA: Diagnosis not present

## 2019-10-26 DIAGNOSIS — D225 Melanocytic nevi of trunk: Secondary | ICD-10-CM | POA: Diagnosis not present

## 2019-12-07 DIAGNOSIS — D485 Neoplasm of uncertain behavior of skin: Secondary | ICD-10-CM | POA: Diagnosis not present

## 2019-12-07 DIAGNOSIS — L988 Other specified disorders of the skin and subcutaneous tissue: Secondary | ICD-10-CM | POA: Diagnosis not present

## 2019-12-12 DIAGNOSIS — H00012 Hordeolum externum right lower eyelid: Secondary | ICD-10-CM | POA: Diagnosis not present

## 2019-12-12 DIAGNOSIS — H2513 Age-related nuclear cataract, bilateral: Secondary | ICD-10-CM | POA: Diagnosis not present

## 2019-12-12 DIAGNOSIS — H10413 Chronic giant papillary conjunctivitis, bilateral: Secondary | ICD-10-CM | POA: Diagnosis not present

## 2019-12-15 ENCOUNTER — Other Ambulatory Visit: Payer: Self-pay

## 2019-12-18 ENCOUNTER — Other Ambulatory Visit: Payer: Self-pay

## 2019-12-18 ENCOUNTER — Encounter: Payer: Self-pay | Admitting: Family Medicine

## 2019-12-18 ENCOUNTER — Ambulatory Visit: Payer: Federal, State, Local not specified - PPO | Admitting: Family Medicine

## 2019-12-18 VITALS — BP 132/80 | HR 77 | Temp 98.1°F | Ht 63.0 in | Wt 168.7 lb

## 2019-12-18 DIAGNOSIS — E559 Vitamin D deficiency, unspecified: Secondary | ICD-10-CM | POA: Diagnosis not present

## 2019-12-18 DIAGNOSIS — J302 Other seasonal allergic rhinitis: Secondary | ICD-10-CM

## 2019-12-18 DIAGNOSIS — E538 Deficiency of other specified B group vitamins: Secondary | ICD-10-CM

## 2019-12-18 DIAGNOSIS — Z23 Encounter for immunization: Secondary | ICD-10-CM

## 2019-12-18 DIAGNOSIS — N200 Calculus of kidney: Secondary | ICD-10-CM | POA: Insufficient documentation

## 2019-12-18 DIAGNOSIS — L219 Seborrheic dermatitis, unspecified: Secondary | ICD-10-CM

## 2019-12-18 DIAGNOSIS — K5100 Ulcerative (chronic) pancolitis without complications: Secondary | ICD-10-CM | POA: Diagnosis not present

## 2019-12-18 DIAGNOSIS — Z131 Encounter for screening for diabetes mellitus: Secondary | ICD-10-CM

## 2019-12-18 DIAGNOSIS — L309 Dermatitis, unspecified: Secondary | ICD-10-CM | POA: Insufficient documentation

## 2019-12-18 DIAGNOSIS — Z13220 Encounter for screening for lipoid disorders: Secondary | ICD-10-CM

## 2019-12-18 LAB — COMPREHENSIVE METABOLIC PANEL
ALT: 11 U/L (ref 0–35)
AST: 14 U/L (ref 0–37)
Albumin: 4.6 g/dL (ref 3.5–5.2)
Alkaline Phosphatase: 62 U/L (ref 39–117)
BUN: 17 mg/dL (ref 6–23)
CO2: 31 mEq/L (ref 19–32)
Calcium: 9.7 mg/dL (ref 8.4–10.5)
Chloride: 97 mEq/L (ref 96–112)
Creatinine, Ser: 0.73 mg/dL (ref 0.40–1.20)
GFR: 84.24 mL/min (ref >60.00)
Glucose, Bld: 100 mg/dL — ABNORMAL HIGH (ref 70–99)
Potassium: 3.3 mEq/L — ABNORMAL LOW (ref 3.5–5.1)
Sodium: 137 mEq/L (ref 135–145)
Total Bilirubin: 1.2 mg/dL (ref 0.2–1.2)
Total Protein: 7.4 g/dL (ref 6.0–8.3)

## 2019-12-18 LAB — CBC WITH DIFFERENTIAL/PLATELET
Basophils Absolute: 0.1 10*3/uL (ref 0.0–0.1)
Basophils Relative: 0.7 % (ref 0.0–3.0)
Eosinophils Absolute: 0.1 10*3/uL (ref 0.0–0.7)
Eosinophils Relative: 1.5 % (ref 0.0–5.0)
HCT: 40.8 % (ref 36.0–46.0)
Hemoglobin: 13.6 g/dL (ref 12.0–15.0)
Lymphocytes Relative: 31.5 % (ref 12.0–46.0)
Lymphs Abs: 2.6 10*3/uL (ref 0.7–4.0)
MCHC: 33.4 g/dL (ref 30.0–36.0)
MCV: 86.1 fl (ref 78.0–100.0)
Monocytes Absolute: 0.7 10*3/uL (ref 0.1–1.0)
Monocytes Relative: 8.1 % (ref 3.0–12.0)
Neutro Abs: 4.9 10*3/uL (ref 1.4–7.7)
Neutrophils Relative %: 58.2 % (ref 43.0–77.0)
Platelets: 308 10*3/uL (ref 150.0–400.0)
RBC: 4.74 Mil/uL (ref 3.87–5.11)
RDW: 14 % (ref 11.5–15.5)
WBC: 8.3 10*3/uL (ref 4.0–10.5)

## 2019-12-18 LAB — LIPID PANEL
Cholesterol: 153 mg/dL (ref 0–200)
HDL: 59.5 mg/dL (ref >39.00)
LDL Cholesterol: 72 mg/dL (ref 0–99)
NonHDL: 93.84
Total CHOL/HDL Ratio: 3
Triglycerides: 109 mg/dL (ref 0.0–149.0)
VLDL: 21.8 mg/dL (ref 0.0–40.0)

## 2019-12-18 LAB — VITAMIN B12: Vitamin B-12: 205 pg/mL — ABNORMAL LOW (ref 211–911)

## 2019-12-18 LAB — TSH: TSH: 2.26 u[IU]/mL (ref 0.35–4.50)

## 2019-12-18 LAB — VITAMIN D 25 HYDROXY (VIT D DEFICIENCY, FRACTURES): VITD: 86.55 ng/mL (ref 30.00–100.00)

## 2019-12-18 MED ORDER — FLUTICASONE PROPIONATE 50 MCG/ACT NA SUSP: 2.0000 | Freq: Every day | NASAL | 6 refills | Status: AC

## 2019-12-18 MED ORDER — OLOPATADINE HCL 0.1 % OP SOLN: 1.0000 [drp] | Freq: Two times a day (BID) | OPHTHALMIC | 12 refills | Status: AC

## 2019-12-18 NOTE — Progress Notes (Signed)
Elizabeth Huerta DOB: [birth year] Encounter date: 12/18/2019  This isa 49 y.o. female who presents to establish care. Chief Complaint  Patient presents with  . Establish Care    History of present illness: Has a lot of specialists, but just wants her regular bloodwork and basics checked.  Sees GI for IBD. Supposed to have follow up colonoscopy. No blood in the stools. Had c diff. Bowels feel back to normal.   Sees Dr. Tresa Moore for recurrent kidney stones. Have been well controlled with dapamide.   Follows with derm regularly for skin moles q 6 months. Eczema, seborrheic dermatitis.  Follows with Dr. Posey Pronto for eye needs. Had some issues with recurrent styes. She was supposed to get a wash for eyes, but now recalls that she didn't get this.   Seasonal allergies: have been pretty good this year.  Has been working on healthy eating and weight loss. Eating more chicken, fruits, veggies but low carb overall. Has lost 15lbs in last 4 months. Energy level feels pretty good overall. Not exercising like she should but she plans to get back on track with this.   Past Medical History:  Diagnosis Date  . Anxiety 2003   treated  . Basal cell carcinoma    nose  . C. difficile colitis 06/20/2015  . Eczema   . History of basal cell carcinoma excision    2010-- nasal area  . Hypertension   . Hypokalemia   . Low vitamin D level   . Nephrolithiasis    LEFT  . Renal cyst, right   . Right ureteral stone   . Universal ulcerative colitis ([place]) 02/25/2017   Past Surgical History:  Procedure Laterality Date  . BUNIONECTOMY WITH TARSAL MEDITARSAL FUSION Right 09/28/2017  . CESAREAN SECTION  2001  &  2003  . COLONOSCOPY  02/2017   ulcerative colitis  . CYSTOSCOPY/RETROGRADE/URETEROSCOPY Right 09/28/2014   Procedure: CYSTOSCOPY RIGHT RETROGRADE RIGHT URETEROSCOPY;  Surgeon: Alexis Frock, MD;  Location: [facility];  Service: Urology;  Laterality: Right;  . EXTRACORPOREAL SHOCK WAVE  LITHOTRIPSY Bilateral left 05-15-2010/   right 07-03-2010  . HOLMIUM LASER APPLICATION Right 2/59/5638   Procedure: HOLMIUM LASER OF STONES ;  Surgeon: Alexis Frock, MD;  Location: [facility];  Service: Urology;  Laterality: Right;  . MOHS SURGERY  2010   nasal area  . MOHS SURGERY  11/2018   nose and upper lip   Allergies  Allergen Reactions  . Adhesive [Tape] Hives  . Latex Rash   Current Meds  Medication Sig  . cetirizine (ZYRTEC) 10 MG tablet Take 10 mg by mouth at bedtime.   . indapamide (LOZOL) 2.5 MG tablet Take 1 tablet by mouth daily.  . Melatonin 3 MG TABS Take 3 mg by mouth at bedtime.   . mesalamine (LIALDA) 1.2 g EC tablet Take 2 tablets (2.4 g total) by mouth daily with breakfast.  . NORLYDA 0.35 MG tablet TAKE 1 TABLET(0.35 MG) BY MOUTH DAILY  . nystatin (MYCOSTATIN/NYSTOP) powder Apply topically 3 (three) times daily. Apply to affected area for up to 7 days  . saccharomyces boulardii (FLORASTOR) 250 MG capsule Take 250 mg by mouth 2 (two) times daily as needed for other. When on antibiotics   . VITAMIN D PO Take 5,000 Units by mouth daily.   Social History   Tobacco Use  . Smoking status: Former Smoker    Years: 2.00    Types: Cigarettes  . Smokeless tobacco: Never Used  .  Tobacco comment: minimal in college may 5-6 per year   Substance Use Topics  . Alcohol use: Yes    Alcohol/week: 2.0 - 3.0 standard drinks    Types: 2 - 3 Standard drinks or equivalent per week   Family History  Problem Relation Age of Onset  . Hypertension Father   . Heart disease Father        stents  . Skin cancer Father        basal cell  . Osteoarthritis Father   . Heart attack Father 3  . Alcohol abuse Father   . Diabetes Father 25  . Hearing loss Father   . Hypertension Sister   . Hyperlipidemia Sister   . Asthma Mother   . COPD Mother   . Skin cancer Mother        basal cell  . Alcohol abuse Mother   . Osteoarthritis Mother   . Depression Mother    . Hearing loss Mother   . Alcohol abuse Maternal Grandmother   . Alcohol abuse Maternal Grandfather   . Heart attack Paternal Grandfather 64     Review of Systems  Constitutional: Negative for chills, fatigue and fever.  Respiratory: Negative for cough, chest tightness, shortness of breath and wheezing.   Cardiovascular: Negative for chest pain, palpitations and leg swelling.    Objective:  BP 132/80 (BP Location: Left Arm, Patient Position: Sitting, Cuff Size: Normal)   Pulse 77   Temp 98.1 F (36.7 C) (Temporal)   Ht 5' 3"  (1.6 m)   Wt 168 lb 11.2 oz (76.5 kg)   LMP 12/04/2019 (Exact Date)   BMI 29.88 kg/m   Weight: 168 lb 11.2 oz (76.5 kg)   BP Readings from Last 3 Encounters:  12/18/19 132/80  04/05/19 140/70  03/29/18 110/78   Wt Readings from Last 3 Encounters:  12/18/19 168 lb 11.2 oz (76.5 kg)  04/05/19 178 lb 9.6 oz (81 kg)  03/29/18 177 lb 12.8 oz (80.6 kg)    Physical Exam Constitutional:      General: She is not in acute distress.    Appearance: She is well-developed.  Cardiovascular:     Rate and Rhythm: Normal rate and regular rhythm.     Heart sounds: Normal heart sounds. No murmur. No friction rub.  Pulmonary:     Effort: Pulmonary effort is normal. No respiratory distress.     Breath sounds: Normal breath sounds. No wheezing or rales.  Musculoskeletal:     Right lower leg: No edema.     Left lower leg: No edema.  Neurological:     Mental Status: She is alert and oriented to person, place, and time.  Psychiatric:        Behavior: Behavior normal.     Assessment/Plan:  1. Eczema, unspecified type Follows with dermatology.  Well-controlled. - TSH; Future - TSH  2. Seborrheic dermatitis Follows with dermatology.  Well-controlled.  3. Universal ulcerative colitis without complication (HCC) Currently asymptomatic.  Follows with gastroenterology on a regular basis.  She is going to call for follow-up colonoscopy. - CBC with  Differential/Platelet; Future - Comprehensive metabolic panel; Future - Comprehensive metabolic panel - CBC with Differential/Platelet  4. Nephrolithiasis Follows with urology.  Has done well in the last year.  5. Vitamin D deficiency - VITAMIN D 25 Hydroxy (Vit-D Deficiency, Fractures); Future - VITAMIN D 25 Hydroxy (Vit-D Deficiency, Fractures)  6. Vitamin B 12 deficiency - Vitamin B12; Future - Vitamin B12  7. Seasonal allergies Stable.  Continue current medications. - fluticasone (FLONASE) 50 MCG/ACT nasal spray; Place 2 sprays into both nostrils daily.  Dispense: 16 g; Refill: 6 - olopatadine (PATANOL) 0.1 % ophthalmic solution; Place 1 drop into both eyes 2 (two) times daily.  Dispense: 5 mL; Refill: 12  8. Lipid screening - Lipid panel; Future - Lipid panel  9. Screening for diabetes mellitus - Comprehensive metabolic panel; Future - Comprehensive metabolic panel  10. Need for Tdap vaccination - Tdap vaccine greater than or equal to 7yo IM  Return for pending bloodwork. We discussed getting pneumonia vaccine and shingles at follow-up. Micheline Rough, MD

## 2019-12-20 ENCOUNTER — Other Ambulatory Visit (INDEPENDENT_AMBULATORY_CARE_PROVIDER_SITE_OTHER): Payer: Federal, State, Local not specified - PPO

## 2019-12-20 DIAGNOSIS — R739 Hyperglycemia, unspecified: Secondary | ICD-10-CM

## 2019-12-20 LAB — HEMOGLOBIN A1C: Hgb A1c MFr Bld: 5.4 % (ref 4.6–6.5)

## 2020-03-06 ENCOUNTER — Other Ambulatory Visit: Payer: Self-pay | Admitting: Obstetrics and Gynecology

## 2020-03-06 NOTE — Telephone Encounter (Signed)
Medication refill request: Norlyda  Last AEX:  04-05-2019 BS Next AEX: 04-10-20 Last MMG (if hormonal medication request): 04-03-2019 density B/BIRADS 1 negative Refill authorized: Today, please advise.   Medication pended for #28, 0RF. Please refill if appropriate.

## 2020-03-18 ENCOUNTER — Encounter: Payer: Self-pay | Admitting: Family Medicine

## 2020-04-02 ENCOUNTER — Other Ambulatory Visit: Payer: Self-pay | Admitting: Obstetrics and Gynecology

## 2020-04-02 DIAGNOSIS — Z01419 Encounter for gynecological examination (general) (routine) without abnormal findings: Secondary | ICD-10-CM

## 2020-04-02 NOTE — Telephone Encounter (Signed)
Medication refill request: Norylda 0.72m  Last AEX:  04/05/19 Next AEX: 04/10/20 Last MMG (if hormonal medication request): 04/03/19  negative Refill authorized: 28/0

## 2020-04-09 NOTE — Progress Notes (Signed)
49 y.o. G59P2002 Married Caucasian female here for annual exam.    Prior pelvic US showed possible adenomyosis.  EMB- benign proliferative endometrium.   Does have some increased heat.   On POPS.   Completed her Covid vaccine.   Children are at [facility].  PCP:  Micheline Rough, MD   Patient's last menstrual period was 04/05/2020 (exact date).     Period Cycle (Days): 30 Period Duration (Days): 7 Period Pattern: Regular Menstrual Flow: Moderate Menstrual Control: Tampon, Maxi pad Menstrual Control Change Freq (Hours): changes tampon every hour on first day Dysmenorrhea: None     Sexually active: Yes.    The current method of family planning is vasectomy.    Exercising: Yes.    walking Smoker:  no  Health Maintenance: Pap: 11-01-17 Neg:Neg HR HPV, 04-19-13 Neg History of abnormal Pap:  no MMG:  04-03-19 3D/Neg/density B/BiRads1.  She will schedule. Colonoscopy: 02-23-17 normal;next due --pt. Is due but has hx of C.difficile BMD:   n/a  Result  n/a TDaP:  12-18-19 Gardasil:   no HIV: Neg in preg Hep C:unsure Screening Labs:  PCP.   reports that she has quit smoking. Her smoking use included cigarettes. She quit after 2.00 years of use. She has never used smokeless tobacco. She reports current alcohol use of about 2.0 - 3.0 standard drinks of alcohol per week. She reports that she does not use drugs.  Past Medical History:  Diagnosis Date  . Anxiety 2003   treated  . Basal cell carcinoma    nose  . C. difficile colitis 06/20/2015  . Eczema   . History of basal cell carcinoma excision    2010-- nasal area  . Hypertension   . Hypokalemia   . Low vitamin B12 level 2021  . Low vitamin D level   . Nephrolithiasis    LEFT  . Renal cyst, right   . Right ureteral stone   . 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 BUNIONECTOMY WITH TARSAL MEDITARSAL FUSION Right 09/28/2017  . CESAREAN SECTION  2001  &  2003  .  COLONOSCOPY  02/2017   ulcerative colitis  . CYSTOSCOPY/RETROGRADE/URETEROSCOPY Right 09/28/2014   Procedure: CYSTOSCOPY RIGHT RETROGRADE RIGHT URETEROSCOPY;  Surgeon: Alexis Frock, MD;  Location: [facility];  Service: Urology;  Laterality: Right;  . EXTRACORPOREAL SHOCK WAVE LITHOTRIPSY Bilateral left 05-15-2010/   right 07-03-2010  . HOLMIUM LASER APPLICATION Right 4/74/2595   Procedure: HOLMIUM LASER OF STONES ;  Surgeon: Alexis Frock, MD;  Location: [facility];  Service: Urology;  Laterality: Right;  . MOHS SURGERY  2010   nasal area  . MOHS SURGERY  11/2018   nose and upper lip    Current Outpatient Medications  Medication Sig Dispense Refill  . cetirizine (ZYRTEC) 10 MG tablet Take 10 mg by mouth at bedtime.     . clobetasol (TEMOVATE) 0.05 % external solution SMARTSIG:Topical 1 to 2 Times Daily PRN    . fluticasone (FLONASE) 50 MCG/ACT nasal spray Place 2 sprays into both nostrils daily. 16 g 6  . indapamide (LOZOL) 2.5 MG tablet Take 1 tablet by mouth daily.    . Melatonin 3 MG TABS Take 3 mg by mouth at bedtime.     . mesalamine (LIALDA) 1.2 g EC tablet Take 2 tablets (2.4 g total) by mouth daily with breakfast. 180 tablet 0  . NORLYDA 0.35 MG tablet TAKE 1 TABLET(0.35 MG) BY  MOUTH DAILY 28 tablet 0  . nystatin (MYCOSTATIN/NYSTOP) powder Apply topically 3 (three) times daily. Apply to affected area for up to 7 days 30 g 2  . olopatadine (PATANOL) 0.1 % ophthalmic solution Place 1 drop into both eyes 2 (two) times daily. 5 mL 12  . saccharomyces boulardii (FLORASTOR) 250 MG capsule Take 250 mg by mouth 2 (two) times daily as needed for other. When on antibiotics     . vitamin B-12 (CYANOCOBALAMIN) 1000 MCG tablet Take 1,000 mcg by mouth daily.    Marland Kitchen VITAMIN D PO Take 5,000 Units by mouth daily.     No current facility-administered medications for this visit.    Family History  Problem Relation Age of Onset  . Hypertension Father   . Heart  disease Father        stents  . Skin cancer Father        basal cell  . Osteoarthritis Father   . Heart attack Father 50  . Alcohol abuse Father   . Diabetes Father 64  . Hearing loss Father   . Hypertension Sister   . Hyperlipidemia Sister   . Depression Sister   . Asthma Mother   . COPD Mother   . Skin cancer Mother        basal cell  . Alcohol abuse Mother   . Osteoarthritis Mother   . Depression Mother   . Hearing loss Mother   . Alcohol abuse Maternal Grandmother   . Alcohol abuse Maternal Grandfather   . Heart attack Paternal Grandfather 67  . Depression Daughter     Review of Systems  All other systems reviewed and are negative.   Exam:   BP 120/78   Pulse 76   Resp 16   Ht 5' 3"  (1.6 m)   Wt 169 lb 9.6 oz (76.9 kg)   LMP 04/05/2020 (Exact Date)   BMI 30.04 kg/m     General appearance: alert, cooperative and appears stated age Head: normocephalic, without obvious abnormality, atraumatic Neck: no adenopathy, supple, symmetrical, trachea midline and thyroid normal to inspection and palpation Lungs: clear to auscultation bilaterally Breasts: normal appearance, no masses or tenderness, No nipple retraction or dimpling, No nipple discharge or bleeding, No axillary adenopathy Heart: regular rate and rhythm Abdomen: soft, non-tender; no masses, no organomegaly Extremities: extremities normal, atraumatic, no cyanosis or edema Skin: skin color, texture, turgor normal. No rashes or lesions Lymph nodes: cervical, supraclavicular, and axillary nodes normal. Neurologic: grossly normal  Pelvic: External genitalia:  no lesions              No abnormal inguinal nodes palpated.              Urethra:  normal appearing urethra with no masses, tenderness or lesions              Bartholins and Skenes: normal                 Vagina: normal appearing vagina with normal color and discharge, no lesions              Cervix: no lesions.  Light menstrual blood noted.                Pap taken: No. Bimanual Exam:  Uterus:  8 week size, round in shape, and non-tender              Adnexa: no mass, fullness, tenderness  Rectal exam: Yes.  .  Confirms.              Anus:  normal sphincter tone, no lesions  Chaperone was present for exam.  Assessment:   Well woman visit with normal exam. Menorrhagia. Possible adenomyosis.  On POPs. Hx C diff colitis. Inflammatory bowel disease.  Hx renal stones.   Plan: Mammogram screening discussed. Pap and HR HPV as above. Guidelines for Calcium, Vitamin D, regular exercise program including cardiovascular and weight bearing exercise. Check hep C aby.  Follow up annually and prn.   After visit summary provided.

## 2020-04-10 ENCOUNTER — Encounter: Payer: Self-pay | Admitting: Obstetrics and Gynecology

## 2020-04-10 ENCOUNTER — Other Ambulatory Visit: Payer: Self-pay

## 2020-04-10 ENCOUNTER — Ambulatory Visit (INDEPENDENT_AMBULATORY_CARE_PROVIDER_SITE_OTHER): Payer: Federal, State, Local not specified - PPO | Admitting: Obstetrics and Gynecology

## 2020-04-10 VITALS — BP 120/78 | HR 76 | Resp 16 | Ht 63.0 in | Wt 169.6 lb

## 2020-04-10 DIAGNOSIS — Z01419 Encounter for gynecological examination (general) (routine) without abnormal findings: Secondary | ICD-10-CM

## 2020-04-10 DIAGNOSIS — Z119 Encounter for screening for infectious and parasitic diseases, unspecified: Secondary | ICD-10-CM

## 2020-04-10 MED ORDER — NORETHINDRONE 0.35 MG PO TABS: ORAL_TABLET | ORAL | 3 refills | Status: DC

## 2020-04-10 NOTE — Patient Instructions (Signed)

## 2020-04-11 LAB — HEPATITIS C ANTIBODY: Hep C Virus Ab: <0.1 s/co ratio (ref 0.0–0.9)

## 2020-04-23 ENCOUNTER — Other Ambulatory Visit: Payer: Self-pay | Admitting: Obstetrics and Gynecology

## 2020-04-23 DIAGNOSIS — Z1231 Encounter for screening mammogram for malignant neoplasm of breast: Secondary | ICD-10-CM

## 2020-05-02 ENCOUNTER — Ambulatory Visit
Admission: RE | Admit: 2020-05-02 | Discharge: 2020-05-02 | Disposition: A | Discharge status: Home / Self Care | Payer: Federal, State, Local not specified - PPO | Source: Ambulatory Visit | Attending: Obstetrics and Gynecology | Admitting: Obstetrics and Gynecology

## 2020-05-02 ENCOUNTER — Other Ambulatory Visit: Payer: Self-pay

## 2020-05-02 DIAGNOSIS — Z1231 Encounter for screening mammogram for malignant neoplasm of breast: Secondary | ICD-10-CM

## 2020-05-08 DIAGNOSIS — D225 Melanocytic nevi of trunk: Secondary | ICD-10-CM | POA: Diagnosis not present

## 2020-05-08 DIAGNOSIS — L814 Other melanin hyperpigmentation: Secondary | ICD-10-CM | POA: Diagnosis not present

## 2020-05-08 DIAGNOSIS — L2089 Other atopic dermatitis: Secondary | ICD-10-CM | POA: Diagnosis not present

## 2020-05-08 DIAGNOSIS — Z85828 Personal history of other malignant neoplasm of skin: Secondary | ICD-10-CM | POA: Diagnosis not present

## 2020-06-25 DIAGNOSIS — N200 Calculus of kidney: Secondary | ICD-10-CM | POA: Diagnosis not present

## 2020-11-06 DIAGNOSIS — Z85828 Personal history of other malignant neoplasm of skin: Secondary | ICD-10-CM | POA: Diagnosis not present

## 2020-11-06 DIAGNOSIS — D2262 Melanocytic nevi of left upper limb, including shoulder: Secondary | ICD-10-CM | POA: Diagnosis not present

## 2020-11-06 DIAGNOSIS — D2261 Melanocytic nevi of right upper limb, including shoulder: Secondary | ICD-10-CM | POA: Diagnosis not present

## 2020-11-06 DIAGNOSIS — D1801 Hemangioma of skin and subcutaneous tissue: Secondary | ICD-10-CM | POA: Diagnosis not present

## 2020-11-20 ENCOUNTER — Ambulatory Visit: Payer: Federal, State, Local not specified - PPO | Admitting: Internal Medicine

## 2020-11-20 ENCOUNTER — Other Ambulatory Visit: Payer: Self-pay

## 2020-11-20 ENCOUNTER — Encounter: Payer: Self-pay | Admitting: Internal Medicine

## 2020-11-20 VITALS — BP 130/78 | HR 80 | Ht 63.0 in | Wt 170.4 lb

## 2020-11-20 DIAGNOSIS — K51018 Ulcerative (chronic) pancolitis with other complication: Secondary | ICD-10-CM | POA: Diagnosis not present

## 2020-11-20 DIAGNOSIS — E669 Obesity, unspecified: Secondary | ICD-10-CM | POA: Diagnosis not present

## 2020-11-20 DIAGNOSIS — E66811 Obesity, class 1: Secondary | ICD-10-CM

## 2020-11-20 MED ORDER — MESALAMINE 1.2 G PO TBEC
2.4000 g | DELAYED_RELEASE_TABLET | Freq: Every day | ORAL | 3 refills | Status: DC
Start: 2020-11-20 — End: 2021-11-26

## 2020-11-20 NOTE — Patient Instructions (Addendum)
  If you are age 52 or younger, your body mass index should be between 19-25. Your Body mass index is 30.18 kg/m. If this is out of the aformentioned range listed, please consider follow up with your Primary Care Provider.    Dr Carlean Purl is giving you diet information to read and follow.    You have been scheduled for a colonoscopy. Please follow written instructions given to you at your visit today.  Please pick up your prep supplies at the pharmacy within the next 1-3 days. If you use inhalers (even only as needed), please bring them with you on the day of your procedure.  We have sent the following medications to your pharmacy for you to pick up at your convenience: lialda  I appreciate the opportunity to care for you. Silvano Rusk, MD, [facility]

## 2020-11-20 NOTE — Progress Notes (Signed)
Elizabeth Huerta 50 y.o. 01-20-1971 [id number]  Assessment & Plan:   Encounter Diagnoses  Name Primary?  Elizabeth Huerta ulcerative colitis with other complication ([place]) Yes  . Obesity (BMI 30.0-34.9)     Restart Lialda.  None of the mesalamine products seem to be on her formulary through the computer but we will see what happens.  Colonoscopy 2 to 3 months after being back on mesalamine.  Healthy eating changes as below  Read The Obesity Code by Dr. Sharman Huerta or Fast, Feat, Repeat by Elizabeth Huerta and implement  suggestions. Investigate and sign up for the www.dietdoctor.com website if desired and utilize those resources. Checkout Dr. Luis Huerta on YouTube You can also look up Dr. Enrigue Huerta on the Internet and YouTube I have provided handouts on insulin resistance, restricted feeding/intermittent fasting, and proper food choices to lower and eliminate insulin resistance and lose weight. Have a long-term approach to this and do not expect rapid results but have a 1 to 2-year timeframe to change her eating and to become fat adapted.    Subjective:   Chief Complaint: Ulcerative colitis, question schedule colonoscopy  HPI Elizabeth Huerta is a 50 year old woman diagnosed with universal ulcerative colitis in 2018.  She was treated initially with steroids and then Lialda.  About a year ago she came off the [place].  She has not been seen since September 2018.  She reports that on a lower carbohydrate diet she generally does well without symptoms though she has a few beers or pizza she will have diarrhea.  Also about once a month still has some fecal smearing after defecation.  No rectal bleeding.  She has felt well otherwise.   Wt Readings from Last 3 Encounters:  11/20/20 170 lb 6 oz (77.3 kg)  04/10/20 169 lb 9.6 oz (76.9 kg)  12/18/19 168 lb 11.2 oz (76.5 kg)   She had labs a year ago and is due for repeat labs again her CBC and CMET were normal. Allergies  Allergen Reactions  . Adhesive [Tape]  Hives  . Latex Rash   Current Meds  Medication Sig  . cetirizine (ZYRTEC) 10 MG tablet Take 10 mg by mouth at bedtime.   . clobetasol (TEMOVATE) 0.05 % external solution SMARTSIG:Topical 1 to 2 Times Daily PRN  . fluticasone (FLONASE) 50 MCG/ACT nasal spray Place 2 sprays into both nostrils daily.  . indapamide (LOZOL) 2.5 MG tablet Take 1 tablet by mouth daily.  . Melatonin 3 MG TABS Take 3 mg by mouth at bedtime.   . norethindrone (NORLYDA) 0.35 MG tablet TAKE 1 TABLET(0.35 MG) BY MOUTH DAILY  . nystatin (MYCOSTATIN/NYSTOP) powder Apply topically 3 (three) times daily. Apply to affected area for up to 7 days  . olopatadine (PATANOL) 0.1 % ophthalmic solution Place 1 drop into both eyes 2 (two) times daily.  Marland Kitchen saccharomyces boulardii (FLORASTOR) 250 MG capsule Take 250 mg by mouth 2 (two) times daily as needed for other. When on antibiotics   . vitamin B-12 (CYANOCOBALAMIN) 1000 MCG tablet Take 1,000 mcg by mouth daily.  Marland Kitchen VITAMIN D PO Take 5,000 Units by mouth daily.   Past Medical History:  Diagnosis Date  . Anxiety 2003   treated  . Basal cell carcinoma    nose  . C. difficile colitis 06/20/2015  . Eczema   . History of basal cell carcinoma excision    2010-- nasal area  . Hypertension   . Hypokalemia   . Low vitamin B12 level 2021  .  Low vitamin D level   . Nephrolithiasis    LEFT  . Renal cyst, right   . Right ureteral stone   . Universal ulcerative colitis ([place]) 02/25/2017   Past Surgical History:  Procedure Laterality Date  . BUNIONECTOMY WITH TARSAL MEDITARSAL FUSION Right 09/28/2017  . CESAREAN SECTION  2001  &  2003  . COLONOSCOPY  02/2017   ulcerative colitis  . CYSTOSCOPY/RETROGRADE/URETEROSCOPY Right 09/28/2014   Procedure: CYSTOSCOPY RIGHT RETROGRADE RIGHT URETEROSCOPY;  Surgeon: Elizabeth Frock, MD;  Location: [facility];  Service: Urology;  Laterality: Right;  . EXTRACORPOREAL SHOCK WAVE LITHOTRIPSY Bilateral left 05-15-2010/   right  07-03-2010  . HOLMIUM LASER APPLICATION Right 5/99/3570   Procedure: HOLMIUM LASER OF STONES ;  Surgeon: Elizabeth Frock, MD;  Location: [facility];  Service: Urology;  Laterality: Right;  . MOHS SURGERY  2010   nasal area  . 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   Husband Elizabeth Huerta - also patient of Elizabeth Huerta   family history includes Alcohol abuse in her father, maternal grandfather, maternal grandmother, and mother; Asthma in her mother; COPD in her mother; Depression in her daughter, mother, and sister; Diabetes (age of onset: 76) in her father; Hearing loss in her father and mother; Heart attack (age of onset: 28) in her paternal grandfather; Heart attack (age of onset: 96) in her father; Heart disease in her father; Hyperlipidemia in her sister; Hypertension in her father and sister; Osteoarthritis in her father and mother; Skin cancer in her father and mother.   Review of Systems  As above Objective:   Physical Exam BP 130/78 (BP Location: Left Arm, Patient Position: Sitting, Cuff Size: Normal)   Pulse 80   Ht 5' 3"  (1.6 m)   Wt 170 lb 6 oz (77.3 kg)   BMI 30.18 kg/m  Well-developed well-nourished white woman no acute distress Lungs are clear Heart sounds are normal Abdomen is soft nontender mildly obese

## 2020-12-17 DIAGNOSIS — H43812 Vitreous degeneration, left eye: Secondary | ICD-10-CM | POA: Diagnosis not present

## 2020-12-17 DIAGNOSIS — H2513 Age-related nuclear cataract, bilateral: Secondary | ICD-10-CM | POA: Diagnosis not present

## 2020-12-17 DIAGNOSIS — H10413 Chronic giant papillary conjunctivitis, bilateral: Secondary | ICD-10-CM | POA: Diagnosis not present

## 2020-12-17 DIAGNOSIS — H43391 Other vitreous opacities, right eye: Secondary | ICD-10-CM | POA: Diagnosis not present

## 2021-01-07 DIAGNOSIS — D2239 Melanocytic nevi of other parts of face: Secondary | ICD-10-CM | POA: Diagnosis not present

## 2021-01-07 DIAGNOSIS — L080 Pyoderma: Secondary | ICD-10-CM | POA: Diagnosis not present

## 2021-01-15 DIAGNOSIS — U071 COVID-19: Secondary | ICD-10-CM

## 2021-01-15 HISTORY — DX: COVID-19: U07.1

## 2021-01-17 ENCOUNTER — Ambulatory Visit: Payer: Federal, State, Local not specified - PPO | Admitting: Obstetrics and Gynecology

## 2021-01-17 ENCOUNTER — Encounter: Payer: Self-pay | Admitting: Obstetrics and Gynecology

## 2021-01-17 ENCOUNTER — Other Ambulatory Visit: Payer: Self-pay

## 2021-01-17 VITALS — BP 130/80 | Ht 63.0 in | Wt 173.0 lb

## 2021-01-17 DIAGNOSIS — B354 Tinea corporis: Secondary | ICD-10-CM

## 2021-01-17 MED ORDER — CLOTRIMAZOLE 1 % EX CREA: 1.0000 "application " | TOPICAL_CREAM | Freq: Two times a day (BID) | CUTANEOUS | 0 refills | Status: AC

## 2021-01-17 NOTE — Progress Notes (Signed)
GYNECOLOGY  VISIT   HPI: 50 y.o.   Married  Caucasian  female   G2P2002 with Patient's last menstrual period was 01/12/2021.   here for  Red areas on left breast. Not itchy, scaly.  No masses.   No new exposures.  No new bra.  In March, started using a treadmill.   Has Temovate solution which she uses in her umbilicus and under her tummy fold.   Has eczema which is usually very pruritic.  She uses Nystatin under her breast if needed.   GYNECOLOGIC HISTORY: Patient's last menstrual period was 01/12/2021. Contraception: POPs and vasectomy Menopausal hormone therapy:N/A Last mammogram:05-02-20 norm Last pap smear:11-01-17 norm        OB History    Gravida  2   Para  2   Term  2   Preterm      AB      Living  2     SAB      IAB      Ectopic      Multiple      Live Births  2              Patient Active Problem List   Diagnosis Date Noted  . Eczema 12/18/2019  . Seborrheic dermatitis 12/18/2019  . Nephrolithiasis 12/18/2019  . Universal ulcerative colitis ([place]) 02/25/2017    Past Medical History:  Diagnosis Date  . Anxiety 2003   treated  . Basal cell carcinoma    nose  . C. difficile colitis 06/20/2015  . Eczema   . History of basal cell carcinoma excision    2010-- nasal area  . Hypertension   . Hypokalemia   . Low vitamin B12 level 2021  . Low vitamin D level   . Nephrolithiasis    LEFT  . Renal cyst, right   . Right ureteral stone   . Universal ulcerative colitis ([place]) 02/25/2017    Past Surgical History:  Procedure Laterality Date  . BUNIONECTOMY WITH TARSAL MEDITARSAL FUSION Right 09/28/2017  . CESAREAN SECTION  2001  &  2003  . COLONOSCOPY  02/2017   ulcerative colitis  . CYSTOSCOPY/RETROGRADE/URETEROSCOPY Right 09/28/2014   Procedure: CYSTOSCOPY RIGHT RETROGRADE RIGHT URETEROSCOPY;  Surgeon: Alexis Frock, MD;  Location: [facility];  Service: Urology;  Laterality: Right;  . EXTRACORPOREAL SHOCK WAVE LITHOTRIPSY  Bilateral left 05-15-2010/   right 07-03-2010  . HOLMIUM LASER APPLICATION Right 2/99/3716   Procedure: HOLMIUM LASER OF STONES ;  Surgeon: Alexis Frock, MD;  Location: [facility];  Service: Urology;  Laterality: Right;  . MOHS SURGERY  2010   nasal area  . MOHS SURGERY  11/2018   nose and upper lip    Current Outpatient Medications  Medication Sig Dispense Refill  . cetirizine (ZYRTEC) 10 MG tablet Take 10 mg by mouth at bedtime.     . clobetasol (TEMOVATE) 0.05 % external solution SMARTSIG:Topical 1 to 2 Times Daily PRN    . fluticasone (FLONASE) 50 MCG/ACT nasal spray Place 2 sprays into both nostrils daily. 16 g 6  . indapamide (LOZOL) 2.5 MG tablet Take 1 tablet by mouth daily.    . Melatonin 3 MG TABS Take 3 mg by mouth at bedtime.     . norethindrone (NORLYDA) 0.35 MG tablet TAKE 1 TABLET(0.35 MG) BY MOUTH DAILY 84 tablet 3  . nystatin (MYCOSTATIN/NYSTOP) powder Apply topically 3 (three) times daily. Apply to affected area for up to 7 days 30 g 2  .  olopatadine (PATANOL) 0.1 % ophthalmic solution Place 1 drop into both eyes 2 (two) times daily. 5 mL 12  . saccharomyces boulardii (FLORASTOR) 250 MG capsule Take 250 mg by mouth 2 (two) times daily as needed for other. When on antibiotics     . VITAMIN D PO Take 5,000 Units by mouth daily.    . mesalamine (LIALDA) 1.2 g EC tablet Take 2 tablets (2.4 g total) by mouth daily with breakfast. 180 tablet 3  . vitamin B-12 (CYANOCOBALAMIN) 1000 MCG tablet Take 1,000 mcg by mouth daily. (Patient not taking: Reported on 01/17/2021)     No current facility-administered medications for this visit.     ALLERGIES: Adhesive [tape] and Latex  Family History  Problem Relation Age of Onset  . Hypertension Father   . Heart disease Father        stents  . Skin cancer Father        basal cell  . Osteoarthritis Father   . Heart attack Father 76  . Alcohol abuse Father   . Diabetes Father 96  . Hearing loss Father   .  Hypertension Sister   . Hyperlipidemia Sister   . Depression Sister   . Asthma Mother   . COPD Mother   . Skin cancer Mother        basal cell  . Alcohol abuse Mother   . Osteoarthritis Mother   . Depression Mother   . Hearing loss Mother   . Alcohol abuse Maternal Grandmother   . Alcohol abuse Maternal Grandfather   . Heart attack Paternal Grandfather 34  . Depression Daughter   . Colon cancer Neg Hx   . Esophageal cancer Neg Hx   . Pancreatic cancer Neg Hx   . Stomach cancer Neg Hx     Social History   Socioeconomic History  . Marital status: Married    Spouse name: Not on file  . Number of children: 2  . Years of education: 7  . Highest education level: Not on file  Occupational History    Employer: [employer]   Tobacco Use  . Smoking status: Former Smoker    Years: 2.00    Types: Cigarettes  . Smokeless tobacco: Never Used  . Tobacco comment: minimal in college may 5-6 per year   Vaping Use  . Vaping Use: Never used  Substance and Sexual Activity  . Alcohol use: Yes    Alcohol/week: 2.0 - 3.0 standard drinks    Types: 2 - 3 Standard drinks or equivalent per week  . Drug use: No  . Sexual activity: Yes    Partners: Male    Birth control/protection: Other-see comments, Pill    Comment: Husband had vasectomy  Other Topics Concern  . 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 Carlean Purl   Social Determinants of Health   Financial Resource Strain: Not on file  Food Insecurity: Not on file  Transportation Needs: Not on file  Physical Activity: Not on file  Stress: Not on file  Social Connections: Not on file  Intimate Partner Violence: Not on file    Review of Systems  PHYSICAL EXAMINATION:    BP 130/80   Ht 5' 3"  (1.6 m)   Wt 173 lb (78.5 kg)   LMP 01/12/2021   BMI 30.65 kg/m     General appearance: alert, cooperative and appears stated age   Breasts:  right - normal  appearance, no masses or tenderness, No nipple retraction or dimpling, No nipple discharge or bleeding, No axillary adenopathy Left - patches of subtle erythematous rash of left breast at 12 and 6:00 and under breast fold.  No masses or tenderness, No nipple retraction or dimpling, No nipple discharge or bleeding, No axillary adenopathy  Chaperone was present for exam.  ASSESSMENT  Skin rash of breast.  I suspect tinea corporis.  Hx eczema.  PLAN  Rx for Clotrimazole 1% cream to area twice daily for 2 weeks.  If rash does not resolve or is worsening, I recommend she see her dermatologist, Dr. Fontaine No. Ok to do routine mammogram in September, 2022.    20 min  total time was spent for this patient encounter, including preparation, face-to-face counseling with the patient, coordination of care, and documentation of the encounter.

## 2021-02-18 ENCOUNTER — Encounter: Payer: Federal, State, Local not specified - PPO | Admitting: Internal Medicine

## 2021-03-05 ENCOUNTER — Other Ambulatory Visit: Payer: Self-pay

## 2021-03-05 ENCOUNTER — Encounter: Payer: Self-pay | Admitting: Internal Medicine

## 2021-03-05 ENCOUNTER — Ambulatory Visit (AMBULATORY_SURGERY_CENTER): Payer: Federal, State, Local not specified - PPO | Admitting: Internal Medicine

## 2021-03-05 VITALS — BP 133/75 | HR 66 | Temp 98.0°F | Resp 16 | Ht 63.0 in | Wt 170.0 lb

## 2021-03-05 DIAGNOSIS — K529 Noninfective gastroenteritis and colitis, unspecified: Secondary | ICD-10-CM | POA: Diagnosis not present

## 2021-03-05 DIAGNOSIS — K51018 Ulcerative (chronic) pancolitis with other complication: Secondary | ICD-10-CM | POA: Diagnosis not present

## 2021-03-05 DIAGNOSIS — K5190 Ulcerative colitis, unspecified, without complications: Secondary | ICD-10-CM | POA: Diagnosis not present

## 2021-03-05 MED ORDER — SODIUM CHLORIDE 0.9 % IV SOLN: 500.0000 mL | Freq: Once | INTRAVENOUS | Status: DC

## 2021-03-05 NOTE — Op Note (Signed)
[place] Patient Name: Elizabeth Huerta Procedure Date: 03/05/2021 10:25 AM MRN: [id number] Endoscopist: Gatha Mayer , MD Age: 50 Referring MD:  Date of Birth: June 01, 1971 Gender: Female Account #: [id number] Procedure:                Colonoscopy Indications:              Chronic ulcerative pancolitis, Follow-up of chronic                            ulcerative pancolitis, Disease activity assessment                            of chronic ulcerative pancolitis, Assess                            therapeutic response to therapy of chronic                            ulcerative pancolitis Medicines:                Propofol per Anesthesia, Monitored Anesthesia Care Procedure:                Pre-Anesthesia Assessment:                           - Prior to the procedure, a History and Physical                            was performed, and patient medications and                            allergies were reviewed. The patient's tolerance of                            previous anesthesia was also reviewed. The risks                            and benefits of the procedure and the sedation                            options and risks were discussed with the patient.                            All questions were answered, and informed consent                            was obtained. Prior Anticoagulants: The patient has                            taken no previous anticoagulant or antiplatelet                            agents. ASA Grade Assessment: II - A patient with  mild systemic disease. After reviewing the risks                            and benefits, the patient was deemed in                            satisfactory condition to undergo the procedure.                           After obtaining informed consent, the colonoscope                            was passed under direct vision. Throughout the                            procedure, the patient's blood  pressure, pulse, and                            oxygen saturations were monitored continuously. The                            PCF-HQ190L Colonoscope was introduced through the                            anus and advanced to the the terminal ileum, with                            identification of the appendiceal orifice and IC                            valve. The colonoscopy was performed without                            difficulty. The patient tolerated the procedure                            well. The quality of the bowel preparation was                            good. The bowel preparation used was Miralax via                            split dose instruction. The terminal ileum,                            ileocecal valve, appendiceal orifice, and rectum                            were photographed. Scope In: 10:37:35 AM Scope Out: 10:54:01 AM Scope Withdrawal Time: 0 hours 12 minutes 24 seconds  Total Procedure Duration: 0 hours 16 minutes 26 seconds  Findings:                 The perianal and digital rectal examinations were  normal.                           The terminal ileum appeared normal.                           Inflammation was found as patches surrounded by                            normal mucosa in the transverse colon and in the                            ascending colon. This was graded as Mayo Score 1                            (mild, with erythema, decreased vascular pattern,                            mild friability), and when compared to the previous                            examination, the findings are improved. Two                            biopsies were taken every 10 cm with a cold forceps                            from the rectum and entire colon for ulcerative                            colitis surveillance. These biopsy specimens from                            the Cecum/ascend; transverse; desc/prox sigmoid;                             distal sigmoid/rectum were sent to Pathology.                            Verification of patient identification for the                            specimen was done. Estimated blood loss was minimal.                           A few diverticula were found in the sigmoid colon.                           The exam was otherwise without abnormality on                            direct and retroflexion views. Complications:            No immediate complications. Estimated Blood Loss:     Estimated blood  loss was minimal. Impression:               - The examined portion of the ileum was normal.                           - Mild (Mayo Score 1) pancolitis ulcerative                            colitis, improved since the last examination.                            Minimal inflammatory changes ascendinbg and                            transverse - slight erythema and aphthaeBiopsied.                            dx 2018                           - Diverticulosis in the sigmoid colon.                           - The examination was otherwise normal on direct                            and retroflexion views. Recommendation:           - Patient has a contact number available for                            emergencies. The signs and symptoms of potential                            delayed complications were discussed with the                            patient. Return to normal activities tomorrow.                            Written discharge instructions were provided to the                            patient.                           - Resume previous diet.                           - Continue present medications. Had restarted                            mesalamine 2.4 g daily in April after lapse in                            treatment                           -  Repeat colonoscopy is recommended. The                            colonoscopy date will be determined after pathology                             results from today's exam become available for                            review. Gatha Mayer, MD 03/05/2021 11:01:57 AM This report has been signed electronically.

## 2021-03-05 NOTE — Progress Notes (Signed)
[facility]

## 2021-03-05 NOTE — Patient Instructions (Addendum)
I saw some areas of scarring and perhaps a few areas of slight inflammation.  Biopsies taken and will let you know results and plans.  I appreciate the opportunity to care for you. Gatha Mayer, MD, [facility]   Discharge instructions given. Handout on Diverticulosis. Resume previous medications. YOU HAD AN ENDOSCOPIC PROCEDURE TODAY AT [facility]:   Refer to the procedure report that was given to you for any specific questions about what was found during the examination.  If the procedure report does not answer your questions, please call your gastroenterologist to clarify.  If you requested that your care partner not be given the details of your procedure findings, then the procedure report has been included in a sealed envelope for you to review at your convenience later.  YOU SHOULD EXPECT: Some feelings of bloating in the abdomen. Passage of more gas than usual.  Walking can help get rid of the air that was put into your GI tract during the procedure and reduce the bloating. If you had a lower endoscopy (such as a colonoscopy or flexible sigmoidoscopy) you may notice spotting of blood in your stool or on the toilet paper. If you underwent a bowel prep for your procedure, you may not have a normal bowel movement for a few days.  Please Note:  You might notice some irritation and congestion in your nose or some drainage.  This is from the oxygen used during your procedure.  There is no need for concern and it should clear up in a day or so.  SYMPTOMS TO REPORT IMMEDIATELY:  Following lower endoscopy (colonoscopy or flexible sigmoidoscopy):  Excessive amounts of blood in the stool  Significant tenderness or worsening of abdominal pains  Swelling of the abdomen that is new, acute  Fever of 100F or higher  For urgent or emergent issues, a gastroenterologist can be reached at any hour by calling [phone]. Do not use MyChart messaging for urgent concerns.    DIET:  We  do recommend a small meal at first, but then you may proceed to your regular diet.  Drink plenty of fluids but you should avoid alcoholic beverages for 24 hours.  ACTIVITY:  You should plan to take it easy for the rest of today and you should NOT DRIVE or use heavy machinery until tomorrow (because of the sedation medicines used during the test).    FOLLOW UP: Our staff will call the number listed on your records 48-72 hours following your procedure to check on you and address any questions or concerns that you may have regarding the information given to you following your procedure. If we do not reach you, we will leave a message.  We will attempt to reach you two times.  During this call, we will ask if you have developed any symptoms of COVID 19. If you develop any symptoms (ie: fever, flu-like symptoms, shortness of breath, cough etc.) before then, please call [phone].  If you test positive for Covid 19 in the 2 weeks post procedure, please call and report this information to [place].    If any biopsies were taken you will be contacted by phone or by letter within the next 1-3 weeks.  Please call us at [phone] if you have not heard about the biopsies in 3 weeks.    SIGNATURES/CONFIDENTIALITY: You and/or your care partner have signed paperwork which will be entered into your electronic medical record.  These signatures attest to the fact that that  the information above on your After Visit Summary has been reviewed and is understood.  Full responsibility of the confidentiality of this discharge information lies with you and/or your care-partner.

## 2021-03-05 NOTE — Progress Notes (Signed)
Report to PACU, RN, vss, BBS= Clear.  

## 2021-03-07 ENCOUNTER — Telehealth: Payer: Self-pay

## 2021-03-07 NOTE — Telephone Encounter (Signed)
  Follow up Call-  Call back number 03/05/2021  Post procedure Call Back phone  # [phone]  Permission to leave phone message Yes  Some recent data might be hidden     Patient questions:  Do you have a fever, pain , or abdominal swelling? No. Pain Score  0 *  Have you tolerated food without any problems? Yes.    Have you been able to return to your normal activities? Yes.    Do you have any questions about your discharge instructions: Diet   No. Medications  No. Follow up visit  No.  Do you have questions or concerns about your Care? No.  Actions: * If pain score is 4 or above: No action needed, pain <4. Have you developed a fever since your procedure? no  2.   Have you had an respiratory symptoms (SOB or cough) since your procedure? no  3.   Have you tested positive for COVID 19 since your procedure no  4.   Have you had any family members/close contacts diagnosed with the COVID 19 since your procedure?  no   If yes to any of these questions please route to Joylene John, RN and Joella Prince, RN

## 2021-03-19 ENCOUNTER — Encounter: Payer: Self-pay | Admitting: Internal Medicine

## 2021-03-24 ENCOUNTER — Other Ambulatory Visit: Payer: Self-pay | Admitting: Obstetrics and Gynecology

## 2021-03-24 DIAGNOSIS — Z01419 Encounter for gynecological examination (general) (routine) without abnormal findings: Secondary | ICD-10-CM

## 2021-03-26 NOTE — Telephone Encounter (Signed)
Annual exam scheduled on 04/16/21, left message to call to see if she has enough pills to last her until then.

## 2021-03-26 NOTE — Telephone Encounter (Signed)
Left message for patient to call.

## 2021-03-27 NOTE — Telephone Encounter (Signed)
Patient called backing stating she does need a refill on Rx. Rx sent.

## 2021-04-16 ENCOUNTER — Ambulatory Visit: Payer: Federal, State, Local not specified - PPO | Admitting: Obstetrics and Gynecology

## 2021-04-17 ENCOUNTER — Encounter: Payer: Self-pay | Admitting: Obstetrics and Gynecology

## 2021-04-17 ENCOUNTER — Ambulatory Visit (INDEPENDENT_AMBULATORY_CARE_PROVIDER_SITE_OTHER): Payer: Federal, State, Local not specified - PPO | Admitting: Obstetrics and Gynecology

## 2021-04-17 ENCOUNTER — Other Ambulatory Visit: Payer: Self-pay

## 2021-04-17 DIAGNOSIS — Z01419 Encounter for gynecological examination (general) (routine) without abnormal findings: Secondary | ICD-10-CM

## 2021-04-17 MED ORDER — NORETHINDRONE 0.35 MG PO TABS: ORAL_TABLET | ORAL | 3 refills | Status: DC

## 2021-04-17 NOTE — Progress Notes (Signed)
50 y.o. G12P2002 Married Caucasian female here for annual exam.    Clotrimazole resolved her rash on her skin.   Still having heavy cycles with Micronor. Super plus tampon and changes every 2 hours.  No cramping.   Some night time hot flashes.   No known anemia.  She has sensitive skin so she is concerned about potential allergic reactions.   PCP:  Candice Camp, MD  Patient's last menstrual period was 04/05/2021 (exact date).     Period Cycle (Days): 30 Period Duration (Days): 7 Period Pattern: Regular Menstrual Flow: Heavy Menstrual Control: Maxi pad, Tampon (overnight pad at night) Menstrual Control Change Freq (Hours): changes maxi pad or super plus tampon every 2 hours on first 2 days of pad     Sexually active: Yes.    The current method of family planning is vasectomy/Micronor   Exercising: Yes.     Treadmill 60-90 minutes M-F Smoker:  no  Health Maintenance: Pap:  11-01-17 Neg:Neg HR HPV, 04-19-13 Neg History of abnormal Pap:  no MMG: 05-02-20 3D/Neg/BiRads1 Colonoscopy:  03-05-21 colitis;next 2 years BMD:   n/a  Result  n/a TDaP: 12-18-19 Gardasil:   no HIV: Neg in preg Hep C: 04-10-20 Neg Screening Labs:  PCP. Shingrix:  will do.    reports that she has quit smoking. Her smoking use included cigarettes. She has never used smokeless tobacco. She reports current alcohol use of about 2.0 standard drinks per week. She reports that she does not use drugs.  Past Medical History:  Diagnosis Date   Allergy    Anxiety 2003   treated   Basal cell carcinoma    nose   C. 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    Past Surgical History:  Procedure Laterality Date    BUNIONECTOMY WITH TARSAL MEDITARSAL FUSION Right 09/28/2017   CESAREAN SECTION  2001  &  2003   COLONOSCOPY  02/2017   ulcerative colitis   CYSTOSCOPY/RETROGRADE/URETEROSCOPY Right 09/28/2014   Procedure: CYSTOSCOPY RIGHT RETROGRADE RIGHT URETEROSCOPY;  Surgeon: Alexis Frock, MD;  Location: [facility];  Service: Urology;  Laterality: Right;   EXTRACORPOREAL SHOCK WAVE LITHOTRIPSY Bilateral left 05-15-2010/   right 07-03-2010   HOLMIUM LASER APPLICATION Right 0/98/1191   Procedure: HOLMIUM LASER OF STONES ;  Surgeon: Alexis Frock, MD;  Location: [facility];  Service: Urology;  Laterality: Right;   MOHS SURGERY  2010   nasal area   MOHS SURGERY  11/2018   nose and upper lip    Current Outpatient Medications  Medication Sig Dispense Refill   cetirizine (ZYRTEC) 10 MG tablet Take 10 mg by mouth at bedtime.      ciclopirox (LOPROX) 0.77 % cream Apply topically 2 (two) times daily.     clobetasol (TEMOVATE) 0.05 % external solution SMARTSIG:Topical 1 to 2 Times Daily PRN     clotrimazole (LOTRIMIN) 1 % cream Apply 1 application topically 2 (two) times daily. Use for 2 weeks. 30 g 0   fluticasone (FLONASE) 50 MCG/ACT nasal spray Place 2 sprays into both nostrils daily. 16 g 6  indapamide (LOZOL) 2.5 MG tablet Take 1 tablet by mouth daily.     Melatonin 3 MG TABS Take 3 mg by mouth at bedtime.      mesalamine (LIALDA) 1.2 g EC tablet Take 2 tablets (2.4 g total) by mouth daily with breakfast. 180 tablet 3   nystatin (MYCOSTATIN/NYSTOP) powder Apply topically 3 (three) times daily. Apply to affected area for up to 7 days 30 g 2   olopatadine (PATANOL) 0.1 % ophthalmic solution Place 1 drop into both eyes 2 (two) times daily. 5 mL 12   saccharomyces boulardii (FLORASTOR) 250 MG capsule Take 250 mg by mouth 2 (two) times daily as needed for other. When on antibiotics      VITAMIN D PO Take 5,000 Units by mouth daily.     norethindrone (NORLYDA) 0.35 MG tablet  TAKE 1 TABLET(0.35 MG) BY MOUTH DAILY 84 tablet 3   No current facility-administered medications for this visit.    Family History  Problem Relation Age of Onset   Asthma Mother    COPD Mother    Skin cancer Mother        basal cell   Alcohol abuse Mother    Osteoarthritis Mother    Depression Mother    Hearing loss Mother    Hypertension Father    Heart disease Father        stents   Skin cancer Father        basal cell   Osteoarthritis Father    Heart attack Father 47   Alcohol abuse Father    Diabetes Father 17   Hearing loss Father    Hypertension Sister    Hyperlipidemia Sister    Depression Sister    Alcohol abuse Maternal Grandmother    Alcohol abuse Maternal Grandfather    Heart attack Paternal Grandfather 35   Depression Daughter    Colon cancer Neg Hx    Esophageal cancer Neg Hx    Pancreatic cancer Neg Hx    Stomach cancer Neg Hx    Rectal cancer Neg Hx     Review of Systems  All other systems reviewed and are negative.  Exam:   BP 136/72   Pulse 77   Ht 5' 3"  (1.6 m)   Wt 171 lb (77.6 kg)   LMP 04/05/2021 (Exact Date)   SpO2 100%   BMI 30.29 kg/m     General appearance: alert, cooperative and appears stated age Head: normocephalic, without obvious abnormality, atraumatic Neck: no adenopathy, supple, symmetrical, trachea midline and thyroid normal to inspection and palpation Lungs: clear to auscultation bilaterally Breasts: normal appearance, no masses or tenderness, No nipple retraction or dimpling, No nipple discharge or bleeding, No axillary adenopathy Heart: regular rate and rhythm Abdomen: soft, non-tender; no masses, no organomegaly Extremities: extremities normal, atraumatic, no cyanosis or edema Skin: skin color, texture, turgor normal. No rashes or lesions Lymph nodes: cervical, supraclavicular, and axillary nodes normal. Neurologic: grossly normal  Pelvic: External genitalia:  no lesions              No abnormal inguinal nodes  palpated.              Urethra:  normal appearing urethra with no masses, tenderness or lesions              Bartholins and Skenes: normal                 Vagina: normal appearing vagina with normal color and discharge, no lesions  Cervix: no lesions              Pap taken: no. Bimanual Exam:  Uterus:  normal size, contour, position, consistency, mobility, non-tender              Adnexa: no mass, fullness, tenderness              Rectal exam: yes.  Confirms.              Anus:  normal sphincter tone, no lesions  Chaperone was present for exam:  Estill Bamberg, CMA.  Assessment:   Well woman visit with gynecologic exam. Menorrhagia. Possible adenomyosis.  On POPs. Hx C diff colitis. Inflammatory bowel disease.  Hx renal stones.  HTN.  Plan: Mammogram screening discussed. Self breast awareness reviewed. Pap and HR HPV 2024.  Guidelines for Calcium, Vitamin D, regular exercise program including cardiovascular and weight bearing exercise. Continue Micronor.  Declines Mirena or endometrial ablation.  Hysterectomy briefly mentioned.  Declines blood work today. Will do with PCP. Follow up annually and prn.   After visit summary provided.

## 2021-04-17 NOTE — Patient Instructions (Signed)

## 2021-04-23 ENCOUNTER — Other Ambulatory Visit: Payer: Self-pay | Admitting: Obstetrics and Gynecology

## 2021-04-23 DIAGNOSIS — Z1231 Encounter for screening mammogram for malignant neoplasm of breast: Secondary | ICD-10-CM

## 2021-05-05 ENCOUNTER — Ambulatory Visit
Admission: RE | Admit: 2021-05-05 | Discharge: 2021-05-05 | Disposition: A | Discharge status: Home / Self Care | Payer: Federal, State, Local not specified - PPO | Source: Ambulatory Visit | Attending: Obstetrics and Gynecology | Admitting: Obstetrics and Gynecology

## 2021-05-05 ENCOUNTER — Other Ambulatory Visit: Payer: Self-pay

## 2021-05-05 DIAGNOSIS — Z1231 Encounter for screening mammogram for malignant neoplasm of breast: Secondary | ICD-10-CM | POA: Diagnosis not present

## 2021-05-14 DIAGNOSIS — L905 Scar conditions and fibrosis of skin: Secondary | ICD-10-CM | POA: Diagnosis not present

## 2021-05-14 DIAGNOSIS — D2261 Melanocytic nevi of right upper limb, including shoulder: Secondary | ICD-10-CM | POA: Diagnosis not present

## 2021-05-14 DIAGNOSIS — D225 Melanocytic nevi of trunk: Secondary | ICD-10-CM | POA: Diagnosis not present

## 2021-05-14 DIAGNOSIS — D485 Neoplasm of uncertain behavior of skin: Secondary | ICD-10-CM | POA: Diagnosis not present

## 2021-05-14 DIAGNOSIS — D2262 Melanocytic nevi of left upper limb, including shoulder: Secondary | ICD-10-CM | POA: Diagnosis not present

## 2021-05-14 DIAGNOSIS — Z85828 Personal history of other malignant neoplasm of skin: Secondary | ICD-10-CM | POA: Diagnosis not present

## 2021-05-14 DIAGNOSIS — L821 Other seborrheic keratosis: Secondary | ICD-10-CM | POA: Diagnosis not present

## 2021-06-26 DIAGNOSIS — N200 Calculus of kidney: Secondary | ICD-10-CM | POA: Diagnosis not present

## 2021-10-28 ENCOUNTER — Telehealth: Payer: Self-pay

## 2021-10-28 NOTE — Telephone Encounter (Signed)
LVM instructions for pt to call office to schedule CPE. ?

## 2021-11-12 DIAGNOSIS — D485 Neoplasm of uncertain behavior of skin: Secondary | ICD-10-CM | POA: Diagnosis not present

## 2021-11-12 DIAGNOSIS — L308 Other specified dermatitis: Secondary | ICD-10-CM | POA: Diagnosis not present

## 2021-11-12 DIAGNOSIS — Z85828 Personal history of other malignant neoplasm of skin: Secondary | ICD-10-CM | POA: Diagnosis not present

## 2021-11-12 DIAGNOSIS — D225 Melanocytic nevi of trunk: Secondary | ICD-10-CM | POA: Diagnosis not present

## 2021-11-12 DIAGNOSIS — L821 Other seborrheic keratosis: Secondary | ICD-10-CM | POA: Diagnosis not present

## 2021-11-26 ENCOUNTER — Other Ambulatory Visit: Payer: Self-pay | Admitting: Internal Medicine

## 2021-11-28 IMAGING — MG MM DIGITAL SCREENING BILAT W/ TOMO AND CAD
8 series · 8 of 24 positions shown · non-contrast
Comparison: Previous exam(s).

CLINICAL DATA: Screening.

EXAM:
DIGITAL SCREENING BILATERAL MAMMOGRAM WITH TOMOSYNTHESIS AND CAD
TECHNIQUE: Bilateral screening digital craniocaudal and mediolateral oblique
mammograms were obtained. Bilateral screening digital breast
tomosynthesis was performed. The images were evaluated with
computer-aided detection.

[R MLO synth-2D]
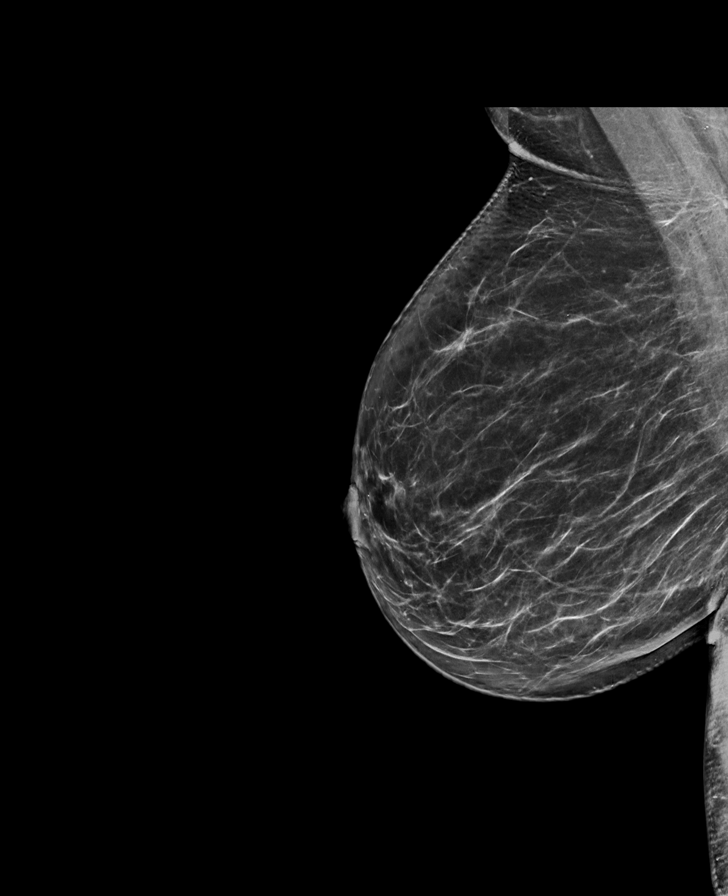

[L CC synth-2D]
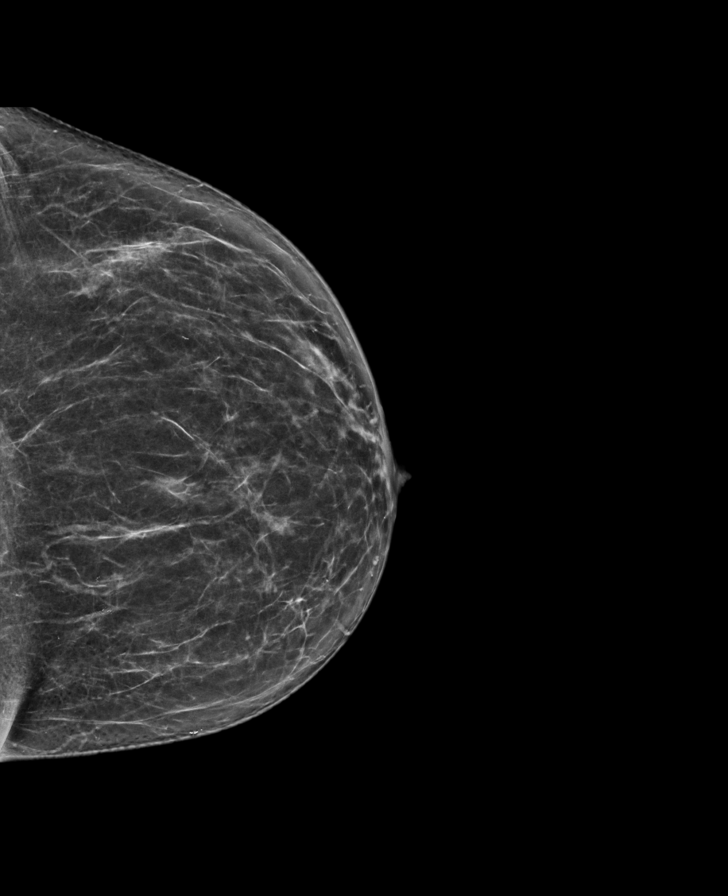

[R CC synth-2D]
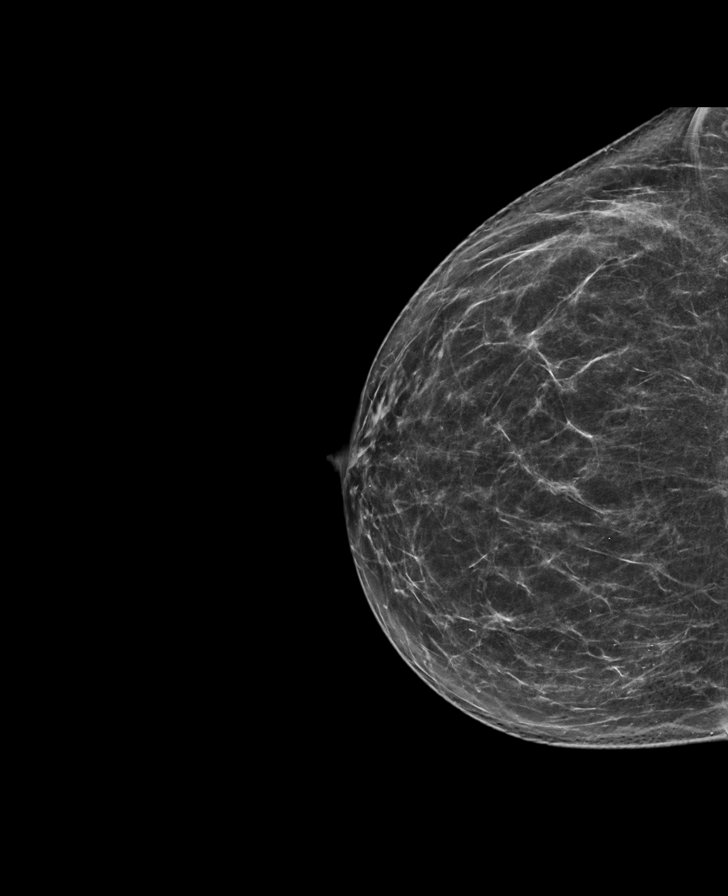

[L MLO synth-2D]
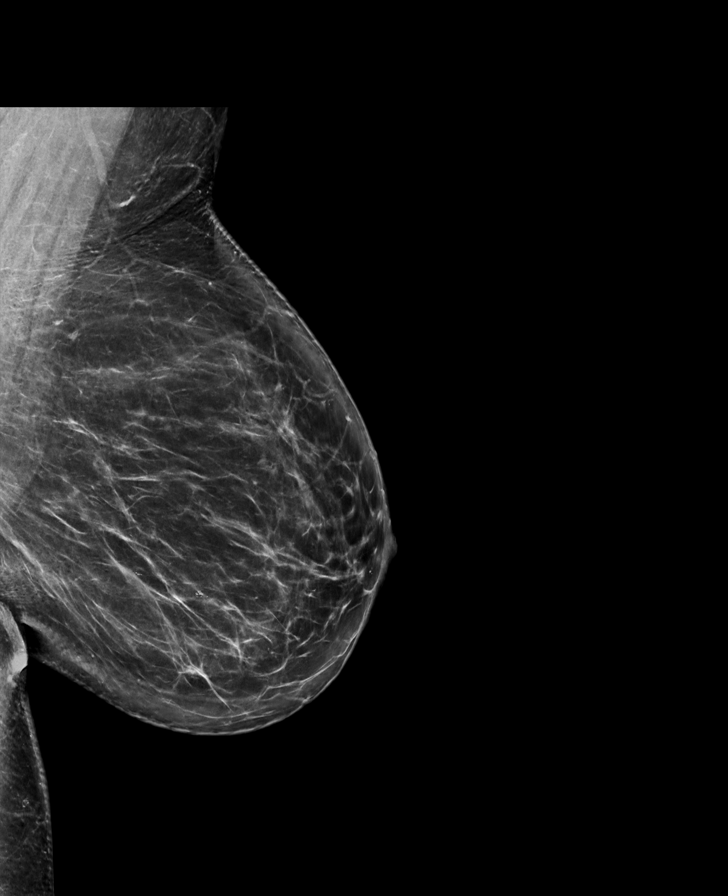

[R MLO tomo · tomo slice 42/83.0]
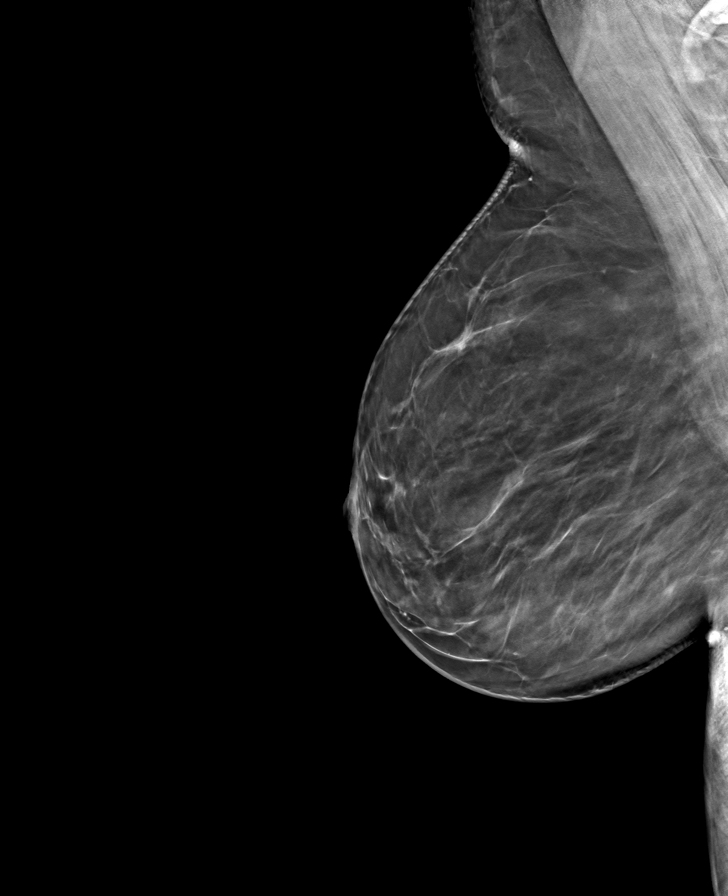

[R CC tomo · tomo slice 35/70.0]
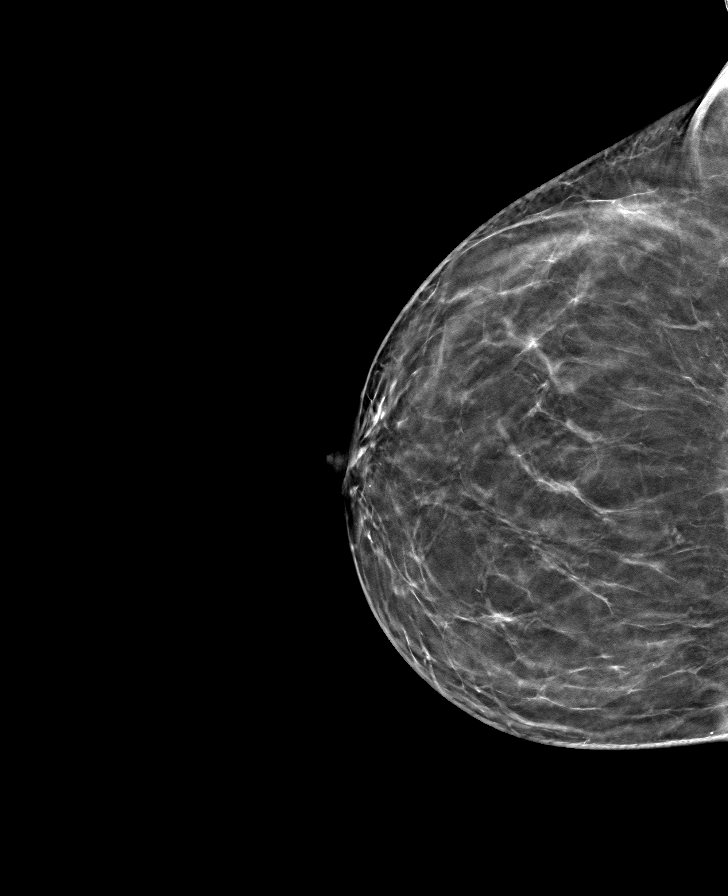

[L MLO tomo · tomo slice 44/87.0]
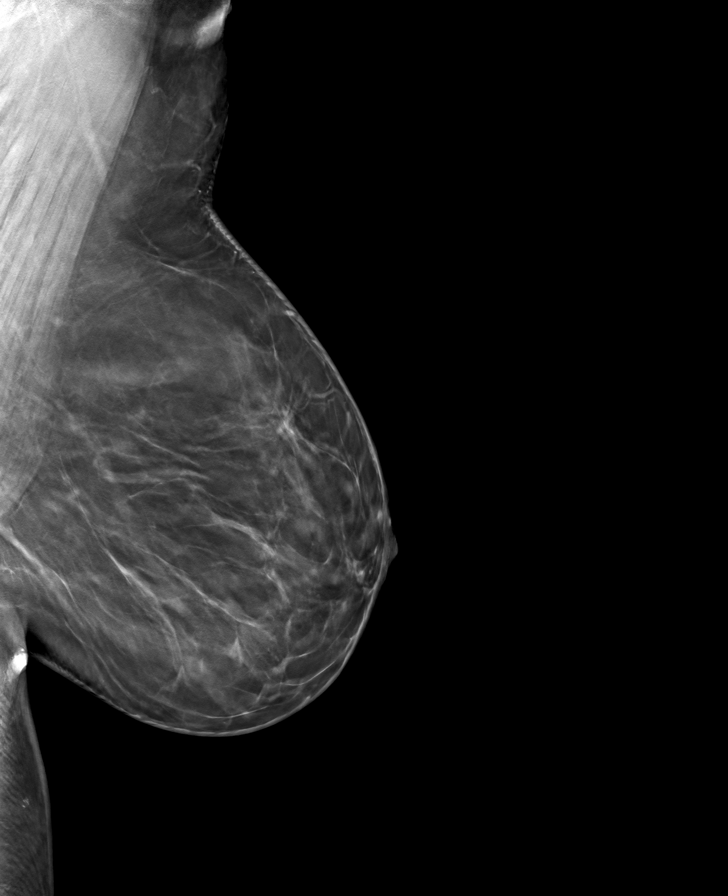

[L CC tomo · tomo slice 35/68.0]
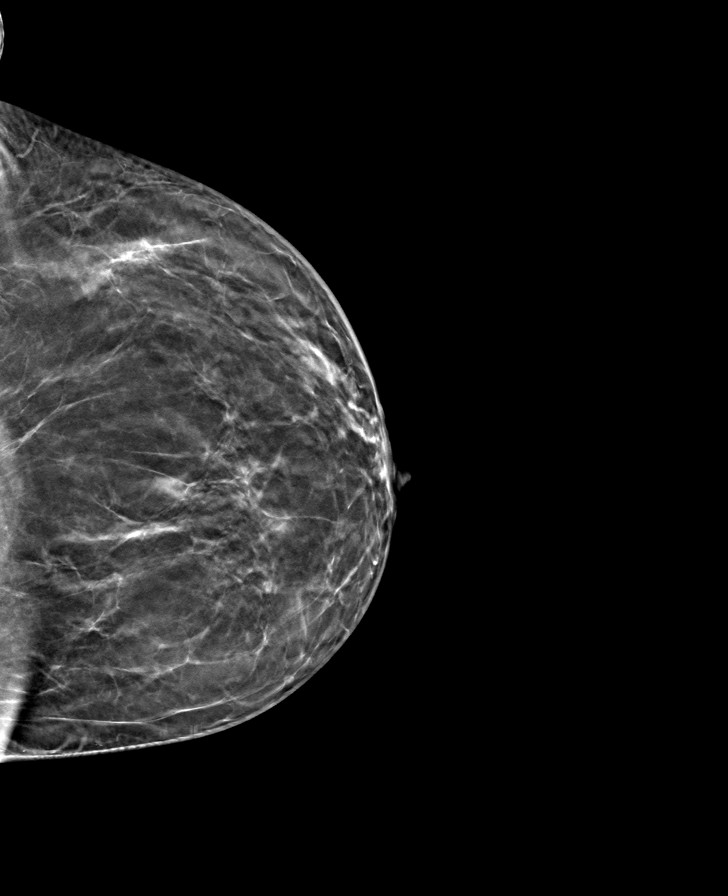

[8 of 24 positions shown; findings below may reference images not displayed]

ACR Breast Density Category b: There are scattered areas of
fibroglandular density.
FINDINGS: There are no findings suspicious for malignancy.
IMPRESSION: No mammographic evidence of malignancy. A result letter of this
screening mammogram will be mailed directly to the patient.

RECOMMENDATION:
Screening mammogram in one year. (Code:51-O-LD2)

BI-RADS CATEGORY  1: Negative.

## 2021-12-23 DIAGNOSIS — H43812 Vitreous degeneration, left eye: Secondary | ICD-10-CM | POA: Diagnosis not present

## 2021-12-23 DIAGNOSIS — H5213 Myopia, bilateral: Secondary | ICD-10-CM | POA: Diagnosis not present

## 2021-12-23 DIAGNOSIS — H40013 Open angle with borderline findings, low risk, bilateral: Secondary | ICD-10-CM | POA: Diagnosis not present

## 2021-12-23 DIAGNOSIS — H2513 Age-related nuclear cataract, bilateral: Secondary | ICD-10-CM | POA: Diagnosis not present

## 2022-01-13 ENCOUNTER — Telehealth: Payer: Federal, State, Local not specified - PPO | Admitting: Family Medicine

## 2022-01-13 DIAGNOSIS — D225 Melanocytic nevi of trunk: Secondary | ICD-10-CM | POA: Diagnosis not present

## 2022-01-13 DIAGNOSIS — D485 Neoplasm of uncertain behavior of skin: Secondary | ICD-10-CM | POA: Diagnosis not present

## 2022-01-13 DIAGNOSIS — H019 Unspecified inflammation of eyelid: Secondary | ICD-10-CM | POA: Diagnosis not present

## 2022-01-13 DIAGNOSIS — H5789 Other specified disorders of eye and adnexa: Secondary | ICD-10-CM

## 2022-01-13 MED ORDER — AMOXICILLIN-POT CLAVULANATE 875-125 MG PO TABS: 1.0000 | ORAL_TABLET | Freq: Two times a day (BID) | ORAL | 0 refills | Status: AC

## 2022-01-13 NOTE — Progress Notes (Signed)
Virtual Visit Consent   [name], you are scheduled for a virtual visit with a [facility] provider today. Just as with appointments in the office, your consent must be obtained to participate. Your consent will be active for this visit and any virtual visit you may have with one of our providers in the next 365 days. If you have a MyChart account, a copy of this consent can be sent to you electronically.  As this is a virtual visit, video technology does not allow for your provider to perform a traditional examination. This may limit your provider's ability to fully assess your condition. If your provider identifies any concerns that need to be evaluated in person or the need to arrange testing (such as labs, EKG, etc.), we will make arrangements to do so. Although advances in technology are sophisticated, we cannot ensure that it will always work on either your end or our end. If the connection with a video visit is poor, the visit may have to be switched to a telephone visit. With either a video or telephone visit, we are not always able to ensure that we have a secure connection.  By engaging in this virtual visit, you consent to the provision of healthcare and authorize for your insurance to be billed (if applicable) for the services provided during this visit. Depending on your insurance coverage, you may receive a charge related to this service.  I need to obtain your verbal consent now. Are you willing to proceed with your visit today? Sharene Krikorian has provided verbal consent on 01/13/2022 for a virtual visit (video or telephone). Perlie Mayo, NP  Date: 01/13/2022 10:25 AM  Virtual Visit via Video Note   I, Perlie Mayo, connected with  Elizabeth Huerta  ([id number], Sep 05, 1970) on 01/13/22 at 10:15 AM EDT by a video-enabled telemedicine application and verified that I am speaking with the correct person using two identifiers.  Location: Patient: Virtual Visit Location Patient:  Home Provider: Virtual Visit Location Provider: Home Office   I discussed the limitations of evaluation and management by telemedicine and the availability of in person appointments. The patient expressed understanding and agreed to proceed.    History of Present Illness: Elizabeth Huerta is a 51 y.o. who identifies as a female who was assigned female at birth, and is being seen today for right eye lower lid swelling and redness and tenderness- onset was Saturday night. Has had crusting and drainage- over night. Cleared this morning and has not had any drainage since. Mostly drainage and crusting at night. Denies fevers, chills, eye movement pain, eye redness.  History of styes in past has had to take doxy for this at one time.   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 , Disp: , Rfl:    ciclopirox (LOPROX) 0.77 % cream, Apply topically 2 (two) times daily., Disp: , Rfl:    clobetasol (TEMOVATE) 0.05 % external solution, SMARTSIG:Topical 1 to 2 Times Daily  PRN, Disp: , Rfl:    clotrimazole (LOTRIMIN) 1 % cream, Apply 1 application topically 2 (two) times daily. Use for 2 weeks., Disp: 30 g, Rfl: 0   fluticasone (FLONASE) 50 MCG/ACT nasal spray, Place 2 sprays into both nostrils daily., Disp: 16 g, Rfl: 6   indapamide (LOZOL) 2.5 MG tablet, Take 1 tablet by mouth daily., Disp: , Rfl:    Melatonin 3 MG TABS, Take 3 mg by mouth at bedtime. , Disp: , Rfl:    mesalamine (LIALDA) 1.2 g EC tablet, TAKE 2  TABLETS(2.4 GRAMS) BY MOUTH DAILY WITH BREAKFAST, Disp: 180 tablet, Rfl: 0   norethindrone (NORLYDA) 0.35 MG tablet, TAKE 1 TABLET(0.35 MG) BY MOUTH DAILY, Disp: 84 tablet, Rfl: 3   nystatin (MYCOSTATIN/NYSTOP) powder, Apply topically 3 (three) times daily. Apply to affected area for up to 7 days, Disp: 30 g, Rfl: 2   olopatadine (PATANOL) 0.1 % ophthalmic solution, Place 1 drop into both eyes 2 (two) times daily., Disp: 5 mL, Rfl: 12   saccharomyces boulardii (FLORASTOR) 250 MG capsule, Take 250 mg by mouth 2 (two) times daily as needed for other. When on antibiotics , Disp: , Rfl:    VITAMIN D PO, Take 5,000 Units by mouth daily., Disp: , Rfl:   Observations/Objective: Patient is well-developed, well-nourished in no acute distress.  Resting comfortably  at home.  Head is normocephalic, atraumatic.  No labored breathing.  Speech is clear and coherent with logical content.  Patient is alert and oriented at baseline.  Right eye lower lid swelling and redness  Eye movements intact  Assessment and Plan: 1. Infected eye lid - amoxicillin-clavulanate (AUGMENTIN) 875-125 MG tablet; Take 1 tablet by mouth 2 (two) times daily for 7 days.  Dispense: 14 tablet; Refill: 0  Concern for start of periorbital cellulitis vs stye like infection of eye lid Will start Augmentin and advised 24-48 strict in person if not showing improvement. No red flags today- eye movement intact and without pain  Reviewed side effects, risks and benefits of medication.    Patient acknowledged agreement and understanding of the plan.    Follow Up Instructions: I discussed the assessment and treatment plan with the patient. The patient was provided an opportunity to ask questions and all were answered. The patient agreed with the plan and demonstrated an understanding of the instructions.  A copy of instructions were sent to the patient via MyChart unless otherwise noted below.     The patient was advised to call back  or seek an in-person evaluation if the symptoms worsen or if the condition fails to improve as anticipated.  Time:  I spent 15 minutes with the patient via telehealth technology discussing the above problems/concerns.    Perlie Mayo, NP

## 2022-01-13 NOTE — Patient Instructions (Addendum)
Risk of eye lid infection vs periorbital cellulitis  If not showing improvement or worsen signs of swelling, pain or redness occur in 24-48 hour please be seen ASAP in an ED.  Avoid harsh rubbing and touch of the right eye as much as possible.  Please complete all medication ordered.

## 2022-01-13 NOTE — Progress Notes (Signed)
[place]   Needs in person eval. Red painful eye with vision changes.  Message sent with details of this.

## 2022-02-18 ENCOUNTER — Telehealth: Payer: Self-pay | Admitting: Internal Medicine

## 2022-02-18 MED ORDER — MESALAMINE 1.2 G PO TBEC: DELAYED_RELEASE_TABLET | ORAL | 0 refills | Status: DC

## 2022-02-18 NOTE — Telephone Encounter (Signed)
Lialda refilled and note attached to call for yearly appointment.

## 2022-02-18 NOTE — Telephone Encounter (Signed)
Inbound call from patient requesting refill for Lialda be sent to pharmacy. Please give patient a call back to advise if necessary.  Thank you

## 2022-03-16 ENCOUNTER — Encounter: Payer: Self-pay | Admitting: Family Medicine

## 2022-03-16 ENCOUNTER — Ambulatory Visit: Payer: Federal, State, Local not specified - PPO | Admitting: Family Medicine

## 2022-03-16 VITALS — BP 116/82 | HR 77 | Temp 97.8°F | Ht 63.0 in | Wt 176.9 lb

## 2022-03-16 DIAGNOSIS — K5100 Ulcerative (chronic) pancolitis without complications: Secondary | ICD-10-CM | POA: Diagnosis not present

## 2022-03-16 DIAGNOSIS — E538 Deficiency of other specified B group vitamins: Secondary | ICD-10-CM | POA: Diagnosis not present

## 2022-03-16 DIAGNOSIS — I10 Essential (primary) hypertension: Secondary | ICD-10-CM | POA: Diagnosis not present

## 2022-03-16 NOTE — Progress Notes (Signed)
Established Patient Office Visit  Subjective   Patient ID: Elizabeth Huerta, female    DOB: 1970-12-29  Age: 51 y.o. MRN: [id number]  Chief Complaint  Patient presents with   Establish Care    Here to establish care.   HM - pt has UC and gets colonscopy regularly. Has one scheduled for next year. Pap was done in 2019 and is due every 5 years instead of every 3 years. States she is feeling well, has no new symptoms or issues to report.    I reviewed her last set of labs, last pap smear in 2019, pt has a GYN who performs her paps and she was told that she didn't need another one until 2024.   HTN -- pt is not currently taking any medications for this problem states she lost about 30-40 pounds and her HTN resolved on its own. States she is feeling food, denies any headaches or chest pain.       Review of Systems  All other systems reviewed and are negative.     Objective:     BP 116/82 (BP Location: Left Arm, Patient Position: Sitting, Cuff Size: Large)   Pulse 77   Temp 97.8 F (36.6 C) (Oral)   Ht 5' 3"  (1.6 m)   Wt 176 lb 14.4 oz (80.2 kg)   LMP 02/08/2022 (Exact Date)   SpO2 98%   BMI 31.34 kg/m    Physical Exam Vitals reviewed.  Constitutional:      Appearance: Normal appearance. She is well-groomed and normal weight.  HENT:     Head: Normocephalic and atraumatic.  Eyes:     Extraocular Movements: Extraocular movements intact.     Conjunctiva/sclera: Conjunctivae normal.     Pupils: Pupils are equal, round, and reactive to light.  Cardiovascular:     Rate and Rhythm: Normal rate and regular rhythm.     Pulses: Normal pulses.     Heart sounds: S1 normal and S2 normal.  Pulmonary:     Effort: Pulmonary effort is normal.     Breath sounds: Normal breath sounds and air entry.  Abdominal:     General: Bowel sounds are normal.     Palpations: Abdomen is soft.  Musculoskeletal:        General: Normal range of motion.     Cervical back: Normal range of  motion and neck supple.     Right lower leg: No edema.     Left lower leg: No edema.  Skin:    General: Skin is warm and dry.  Neurological:     Mental Status: She is alert and oriented to person, place, and time. Mental status is at baseline.     Gait: Gait is intact.  Psychiatric:        Mood and Affect: Mood and affect normal.        Speech: Speech normal.        Behavior: Behavior normal.        Judgment: Judgment normal.      No results found for any visits on 03/16/22.    The 10-year ASCVD risk score (Arnett DK, et al., 2019) is: 1.1%    Assessment & Plan:   Problem List Items Addressed This Visit       Cardiovascular and Mediastinum   RESOLVED: Hypertension    History of, improved due to weight loss. Pt is in need of her CMP, lipid panel and TSH for annual surveillance measures and risk factor identification/  reduction. I have placed these orders.        Digestive   Universal ulcerative colitis ([place])    Well controled symptoms on mesalamine 1.2 gram tablet, she continues to see her GI specialist, has colonoscopy scheduled for next year.        Other   B12 deficiency    Seen on labs in 2021, has not had this followed up on since then, will place order      Relevant Orders   Vitamin B12   Other Visit Diagnoses     HTN (hypertension), benign    -  Primary   Relevant Orders   TSH   Lipid panel   CMP       Return in about 1 year (around 03/17/2023) for annual preventative health visit.    Farrel Conners, MD

## 2022-03-16 NOTE — Assessment & Plan Note (Signed)
Well controled symptoms on mesalamine 1.2 gram tablet, she continues to see her GI specialist, has colonoscopy scheduled for next year.

## 2022-03-16 NOTE — Assessment & Plan Note (Signed)
History of, improved due to weight loss. Pt is in need of her CMP, lipid panel and TSH for annual surveillance measures and risk factor identification/ reduction. I have placed these orders.

## 2022-03-16 NOTE — Assessment & Plan Note (Signed)
Seen on labs in 2021, has not had this followed up on since then, will place order

## 2022-03-17 ENCOUNTER — Other Ambulatory Visit (INDEPENDENT_AMBULATORY_CARE_PROVIDER_SITE_OTHER): Payer: Federal, State, Local not specified - PPO

## 2022-03-17 DIAGNOSIS — I10 Essential (primary) hypertension: Secondary | ICD-10-CM

## 2022-03-17 LAB — LIPID PANEL
Cholesterol: 154 mg/dL (ref 0–200)
HDL: 60.4 mg/dL (ref >39.00)
LDL Cholesterol: 74 mg/dL (ref 0–99)
NonHDL: 93.76
Total CHOL/HDL Ratio: 3
Triglycerides: 100 mg/dL (ref 0.0–149.0)
VLDL: 20 mg/dL (ref 0.0–40.0)

## 2022-05-21 ENCOUNTER — Other Ambulatory Visit: Payer: Self-pay | Admitting: Internal Medicine

## 2022-06-07 ENCOUNTER — Other Ambulatory Visit: Payer: Self-pay | Admitting: Obstetrics and Gynecology

## 2022-06-07 DIAGNOSIS — Z01419 Encounter for gynecological examination (general) (routine) without abnormal findings: Secondary | ICD-10-CM

## 2022-06-15 ENCOUNTER — Encounter: Payer: Self-pay | Admitting: Obstetrics and Gynecology

## 2022-06-15 ENCOUNTER — Ambulatory Visit (INDEPENDENT_AMBULATORY_CARE_PROVIDER_SITE_OTHER): Payer: Federal, State, Local not specified - PPO | Admitting: Obstetrics and Gynecology

## 2022-06-15 VITALS — BP 122/80 | HR 72 | Ht 63.0 in | Wt 177.0 lb

## 2022-06-15 DIAGNOSIS — Z01419 Encounter for gynecological examination (general) (routine) without abnormal findings: Secondary | ICD-10-CM | POA: Diagnosis not present

## 2022-06-15 DIAGNOSIS — Z23 Encounter for immunization: Secondary | ICD-10-CM | POA: Diagnosis not present

## 2022-06-15 DIAGNOSIS — Z0000 Encounter for general adult medical examination without abnormal findings: Secondary | ICD-10-CM

## 2022-06-15 MED ORDER — NORETHINDRONE 0.35 MG PO TABS: ORAL_TABLET | ORAL | 3 refills | Status: DC

## 2022-06-15 NOTE — Patient Instructions (Signed)

## 2022-06-15 NOTE — Progress Notes (Unsigned)
51 y.o. G74P2002 Married Caucasian female here for annual exam.    Skipped 2 cycles and can be lighter.  Using Micronor.   PCP:     Patient's last menstrual period was 06/02/2022.     Menstrual Flow: Moderate, Light Menstrual Control: Maxi pad Dysmenorrhea: (!) Mild Dysmenorrhea Symptoms: Cramping     Sexually active: Yes.    The current method of family planning is vasectomy/Micronor   Exercising: Yes.    Treadmill 60-90 minutes M-F Smoker:  no  Health Maintenance: Pap:   11-01-17 Neg:Neg HR HPV, 04-19-13 Neg History of abnormal Pap:  no MMG:  05/05/2021 BI-RADS CATEGORY  1: Negative. Colonoscopy:  02/2021 - colitis.   Due in 2024.  BMD:  n/a TDaP:  2021 Gardasil:   n/a HIV: neg in pregnancy. Hep C:  2021 - neg. Screening Labs:  PCP. Flu vaccine:  today.   reports that she has quit smoking. Her smoking use included cigarettes. She has never used smokeless tobacco. She reports current alcohol use of about 2.0 standard drinks of alcohol per week. She reports that she does not use drugs.  Past Medical History:  Diagnosis Date   Allergy    Anxiety 2003   treated   Basal cell carcinoma    nose   C. 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    Past Surgical History:  Procedure Laterality Date   BUNIONECTOMY WITH TARSAL MEDITARSAL FUSION Right 09/28/2017   CESAREAN SECTION  2001  &  2003   COLONOSCOPY  02/2017   ulcerative colitis   CYSTOSCOPY/RETROGRADE/URETEROSCOPY Right 09/28/2014   Procedure: CYSTOSCOPY RIGHT RETROGRADE RIGHT URETEROSCOPY;  Surgeon: Alexis Frock, MD;  Location: [facility];  Service: Urology;  Laterality: Right;   EXTRACORPOREAL SHOCK WAVE  LITHOTRIPSY Bilateral left 05-15-2010/   right 07-03-2010   HOLMIUM LASER APPLICATION Right 2/40/9735   Procedure: HOLMIUM LASER OF STONES ;  Surgeon: Alexis Frock, MD;  Location: [facility];  Service: Urology;  Laterality: Right;   MOHS SURGERY  2010   nasal area   MOHS SURGERY  11/2018   nose and upper lip    Current Outpatient Medications  Medication Sig Dispense Refill   cetirizine (ZYRTEC) 10 MG tablet Take 10 mg by mouth at bedtime.      clobetasol (TEMOVATE) 0.05 % external solution SMARTSIG:Topical 1 to 2 Times Daily PRN     clotrimazole (LOTRIMIN) 1 % cream Apply 1 application topically 2 (two) times daily. Use for 2 weeks. 30 g 0   fluticasone (FLONASE) 50 MCG/ACT nasal spray Place 2 sprays into both nostrils daily. 16 g 6   indapamide (LOZOL) 2.5 MG tablet Take 1 tablet by mouth daily.     Melatonin 3 MG TABS Take 3 mg by mouth at bedtime.      mesalamine (LIALDA) 1.2 g EC tablet TAKE 2 TABLETS(2.4 GRAMS) BY MOUTH DAILY WITH BREAKFAST 180 tablet 0   nystatin (MYCOSTATIN/NYSTOP) powder Apply topically 3 (three) times daily. Apply to affected area for up to 7 days 30 g 2   olopatadine (PATANOL) 0.1 %  ophthalmic solution Place 1 drop into both eyes 2 (two) times daily. 5 mL 12   saccharomyces boulardii (FLORASTOR) 250 MG capsule Take 250 mg by mouth 2 (two) times daily as needed for other. When on antibiotics      VITAMIN D PO Take 5,000 Units by mouth daily.     norethindrone (NORLYDA) 0.35 MG tablet TAKE 1 TABLET(0.35 MG) BY MOUTH DAILY 84 tablet 3   No current facility-administered medications for this visit.    Family History  Problem Relation Age of Onset   Asthma Mother    COPD Mother    Skin cancer Mother        basal cell   Alcohol abuse Mother    Osteoarthritis Mother    Depression Mother    Hearing loss Mother    Hypertension Father    Heart disease Father        stents   Skin cancer Father        basal cell   Osteoarthritis Father     Heart attack Father 52   Alcohol abuse Father    Diabetes Father 53   Hearing loss Father    Hypertension Sister    Hyperlipidemia Sister    Depression Sister    Alcohol abuse Maternal Grandmother    Alcohol abuse Maternal Grandfather    Heart attack Paternal Grandfather 73   Depression Daughter    Colon cancer Neg Hx    Esophageal cancer Neg Hx    Pancreatic cancer Neg Hx    Stomach cancer Neg Hx    Rectal cancer Neg Hx     Review of Systems  All other systems reviewed and are negative.   Exam:   BP 122/80 (BP Location: Right Arm, Patient Position: Sitting, Cuff Size: Normal)   Pulse 72   Ht 5' 3"  (1.6 m)   Wt 177 lb (80.3 kg)   LMP 06/02/2022   BMI 31.35 kg/m     General appearance: alert, cooperative and appears stated age Head: normocephalic, without obvious abnormality, atraumatic Neck: no adenopathy, supple, symmetrical, trachea midline and thyroid normal to inspection and palpation Lungs: clear to auscultation bilaterally Breasts: normal appearance, no masses or tenderness, No nipple retraction or dimpling, No nipple discharge or bleeding, No axillary adenopathy Heart: regular rate and rhythm Abdomen: soft, non-tender; no masses, no organomegaly Extremities: extremities normal, atraumatic, no cyanosis or edema Skin: skin color, texture, turgor normal. No rashes or lesions Lymph nodes: cervical, supraclavicular, and axillary nodes normal. Neurologic: grossly normal  Pelvic: External genitalia:  no lesions              No abnormal inguinal nodes palpated.              Urethra:  normal appearing urethra with no masses, tenderness or lesions              Bartholins and Skenes: normal                 Vagina: normal appearing vagina with normal color and discharge, no lesions              Cervix: no lesions              Pap taken: {yes no:314532} Bimanual Exam:  Uterus:  normal size, contour, position, consistency, mobility, non-tender              Adnexa: no mass,  fullness, tenderness  Rectal exam: {yes no:314532}.  Confirms.              Anus:  normal sphincter tone, no lesions  Chaperone was present for exam:  ***  Assessment:   Well woman visit with gynecologic exam. Menorrhagia. Possible adenomyosis.  On POPs. Hx C diff colitis. Inflammatory bowel disease.  Hx renal stones.  HTN.  Plan: Mammogram screening discussed. Self breast awareness reviewed. Pap and HR HPV as above. Guidelines for Calcium, Vitamin D, regular exercise program including cardiovascular and weight bearing exercise. Continue Micronor.  Follow up annually and prn.   Additional counseling given.  {yes [id number]. _______ minutes face to face time of which over 50% was spent in counseling.    After visit summary provided.

## 2022-06-16 LAB — COMPREHENSIVE METABOLIC PANEL
AG Ratio: 1.6 (calc) (ref 1.0–2.5)
ALT: 14 U/L (ref 6–29)
AST: 17 U/L (ref 10–35)
Albumin: 4.8 g/dL (ref 3.6–5.1)
Alkaline phosphatase (APISO): 59 U/L (ref 37–153)
BUN: 18 mg/dL (ref 7–25)
CO2: 28 mmol/L (ref 20–32)
Calcium: 10.2 mg/dL (ref 8.6–10.4)
Chloride: 101 mmol/L (ref 98–110)
Creat: 0.78 mg/dL (ref 0.50–1.03)
Globulin: 3 g/dL (calc) (ref 1.9–3.7)
Glucose, Bld: 92 mg/dL (ref 65–99)
Potassium: 4.2 mmol/L (ref 3.5–5.3)
Sodium: 142 mmol/L (ref 135–146)
Total Bilirubin: 1.1 mg/dL (ref 0.2–1.2)
Total Protein: 7.8 g/dL (ref 6.1–8.1)

## 2022-06-16 LAB — TSH: TSH: 1.82 mIU/L

## 2022-06-16 LAB — VITAMIN D 25 HYDROXY (VIT D DEFICIENCY, FRACTURES): Vit D, 25-Hydroxy: 49 ng/mL (ref 30–100)

## 2022-06-16 LAB — VITAMIN B12: Vitamin B-12: 858 pg/mL (ref 200–1100)

## 2022-06-19 ENCOUNTER — Other Ambulatory Visit: Payer: Self-pay | Admitting: Obstetrics and Gynecology

## 2022-06-19 DIAGNOSIS — Z1231 Encounter for screening mammogram for malignant neoplasm of breast: Secondary | ICD-10-CM

## 2022-07-14 ENCOUNTER — Ambulatory Visit
Admission: RE | Admit: 2022-07-14 | Discharge: 2022-07-14 | Disposition: A | Discharge status: Home / Self Care | Payer: Federal, State, Local not specified - PPO | Source: Ambulatory Visit | Attending: Obstetrics and Gynecology | Admitting: Obstetrics and Gynecology

## 2022-07-14 DIAGNOSIS — Z1231 Encounter for screening mammogram for malignant neoplasm of breast: Secondary | ICD-10-CM

## 2022-07-15 DIAGNOSIS — N200 Calculus of kidney: Secondary | ICD-10-CM | POA: Diagnosis not present

## 2022-07-16 ENCOUNTER — Other Ambulatory Visit: Payer: Self-pay | Admitting: Obstetrics and Gynecology

## 2022-07-16 DIAGNOSIS — R928 Other abnormal and inconclusive findings on diagnostic imaging of breast: Secondary | ICD-10-CM

## 2022-09-10 ENCOUNTER — Other Ambulatory Visit: Payer: Self-pay | Admitting: Internal Medicine

## 2022-09-18 ENCOUNTER — Ambulatory Visit
Admission: RE | Admit: 2022-09-18 | Discharge: 2022-09-18 | Disposition: A | Discharge status: Home / Self Care | Payer: Federal, State, Local not specified - PPO | Source: Ambulatory Visit | Attending: Obstetrics and Gynecology | Admitting: Obstetrics and Gynecology

## 2022-09-18 DIAGNOSIS — R921 Mammographic calcification found on diagnostic imaging of breast: Secondary | ICD-10-CM | POA: Diagnosis not present

## 2022-09-18 DIAGNOSIS — R928 Other abnormal and inconclusive findings on diagnostic imaging of breast: Secondary | ICD-10-CM

## 2022-09-21 ENCOUNTER — Telehealth: Payer: Self-pay | Admitting: Internal Medicine

## 2022-09-21 DIAGNOSIS — K51018 Ulcerative (chronic) pancolitis with other complication: Secondary | ICD-10-CM

## 2022-09-21 MED ORDER — PREDNISONE 10 MG PO TABS: ORAL_TABLET | ORAL | 0 refills | Status: AC

## 2022-09-21 MED ORDER — ONDANSETRON HCL 4 MG PO TABS: 4.0000 mg | ORAL_TABLET | Freq: Three times a day (TID) | ORAL | 1 refills | Status: AC | PRN

## 2022-09-21 NOTE — Telephone Encounter (Signed)
Patient is following up on if she can get an Rx of Zofran and Prednisone

## 2022-09-21 NOTE — Addendum Note (Signed)
Addended by: Gatha Mayer on: 09/21/2022 04:59 PM   Modules accepted: Orders

## 2022-09-21 NOTE — Telephone Encounter (Signed)
Good afternoon Dr. Carlean Purl,   Patient called, stated she sent a message directly on My Chart. Patient is having a IBS flare up, patient would like to know if Imodium is safe to take. Please advise on patient's my chart message.  Thank you.

## 2022-09-21 NOTE — Telephone Encounter (Signed)
Spoke to patient and let her know rxs sent  Meds ordered this encounter  Medications   predniSONE (DELTASONE) 10 MG tablet    Sig: Take 4 tablets (40 mg total) by mouth daily with breakfast for 3 days, THEN 3 tablets (30 mg total) daily with breakfast for 3 days, THEN 2 tablets (20 mg total) daily with breakfast for 7 days, THEN 1 tablet (10 mg total) daily with breakfast for 7 days, THEN 0.5 tablets (5 mg total) daily with breakfast for 7 days.    Dispense:  50 tablet    Refill:  0   ondansetron (ZOFRAN) 4 MG tablet    Sig: Take 1 tablet (4 mg total) by mouth every 8 (eight) hours as needed for nausea or vomiting.    Dispense:  30 tablet    Refill:  1

## 2022-10-07 ENCOUNTER — Encounter: Payer: Self-pay | Admitting: Gastroenterology

## 2022-10-07 ENCOUNTER — Ambulatory Visit: Payer: Federal, State, Local not specified - PPO | Admitting: Gastroenterology

## 2022-10-07 VITALS — BP 120/74 | HR 104 | Ht 63.0 in | Wt 173.0 lb

## 2022-10-07 DIAGNOSIS — K51919 Ulcerative colitis, unspecified with unspecified complications: Secondary | ICD-10-CM | POA: Diagnosis not present

## 2022-10-07 MED ORDER — MESALAMINE 1.2 G PO TBEC: 2.4000 g | DELAYED_RELEASE_TABLET | Freq: Every day | ORAL | 3 refills | Status: DC

## 2022-10-07 NOTE — Patient Instructions (Signed)
We have sent the following medications to your pharmacy for you to pick up at your convenience: Mesalamine 2.4 g daily.   Follow up with Dr. Carlean Purl in May  _______________________________________________________  If your blood pressure at your visit was 140/90 or greater, please contact your primary care physician to follow up on this.  _______________________________________________________  If you are age 54 or older, your body mass index should be between 23-30. Your Body mass index is 30.65 kg/m. If this is out of the aforementioned range listed, please consider follow up with your Primary Care Provider.  If you are age 71 or younger, your body mass index should be between 19-25. Your Body mass index is 30.65 kg/m. If this is out of the aformentioned range listed, please consider follow up with your Primary Care Provider.   ________________________________________________________  The [facility] providers would like to encourage you to use [facility] to communicate with providers for non-urgent requests or questions.  Due to long hold times on the telephone, sending your provider a message by [facility] may be a faster and more efficient way to get a response.  Please allow 48 business hours for a response.  Please remember that this is for non-urgent requests.  _______________________________________________________

## 2022-10-07 NOTE — Progress Notes (Addendum)
10/07/2022 Elizabeth Huerta [id number] Nov 28, 1970   HISTORY OF PRESENT ILLNESS: This is a 52 year old female who is a patient of Dr. Celesta Aver.  She has universal ulcerative colitis.  She is maintained on Lialda 2.4 g daily.  Recently she called for flare of her symptoms.  She was desperate as she was needing to drive to [place] to see her father who was on hospice care before he passed away.  Fortunately she did make it before he passed.  She is still on her prednisone taper.  She feels well.  Having 1 bowel movement a day to every other day.  No bleeding or abdominal pain.  Her last colonoscopy was in July 2022, recommended repeat in 2-year interval so July 2024.  Vitamin B12, vitamin D, TSH, and CMP by her GYN in October 2023 were unremarkable.  She had not been seen here since her last colonoscopy.  She is requesting refills on her Lialda.  Past Medical History:  Diagnosis Date   Allergy    Anxiety 2003   treated   Basal cell carcinoma    nose   C. 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   Past Surgical History:  Procedure Laterality Date   BUNIONECTOMY WITH TARSAL MEDITARSAL FUSION Right 09/28/2017   CESAREAN SECTION  2001  &  2003   COLONOSCOPY  02/2017   ulcerative colitis   CYSTOSCOPY/RETROGRADE/URETEROSCOPY Right 09/28/2014   Procedure: CYSTOSCOPY RIGHT RETROGRADE RIGHT URETEROSCOPY;  Surgeon: Alexis Frock, MD;  Location: [facility];  Service: Urology;  Laterality: Right;   EXTRACORPOREAL SHOCK WAVE LITHOTRIPSY Bilateral left 05-15-2010/   right 07-03-2010   HOLMIUM LASER APPLICATION Right 0000000   Procedure: HOLMIUM LASER OF STONES ;  Surgeon: Alexis Frock, MD;  Location[facility];  Service: Urology;  Laterality: Right;   MOHS SURGERY  2010   nasal area   MOHS SURGERY  11/2018   nose and upper lip    reports that she has quit smoking. Her smoking use included cigarettes. She has never used smokeless tobacco. She reports current alcohol use of about 2.0 standard drinks of alcohol per week. She reports that she does not use drugs. family history includes Alcohol abuse in her father, maternal grandfather, maternal grandmother, and mother; Asthma in her mother; COPD in her mother; Depression in her daughter, mother, and sister; Diabetes (age of onset: 84) in her father; Hearing loss in her father and mother; Heart attack (age of onset: 69) in her paternal grandfather; Heart attack (age of onset: 53) in her father; Heart disease in her father; Hyperlipidemia in her sister; Hypertension in her father and sister; Osteoarthritis in her father and mother; Skin cancer in her father and mother. Allergies  Allergen Reactions   Adhesive [Tape] Hives   Latex Rash      Outpatient Encounter Medications as of 10/07/2022  Medication Sig   cetirizine (ZYRTEC) 10 MG tablet Take 10 mg by mouth at bedtime.    clobetasol (TEMOVATE) 0.05 % external solution SMARTSIG:Topical 1 to 2 Times Daily PRN   clotrimazole (  LOTRIMIN) 1 % cream Apply 1 application topically 2 (two) times daily. Use for 2 weeks.   fluticasone (FLONASE) 50 MCG/ACT nasal spray Place 2 sprays into both nostrils daily.   indapamide (LOZOL) 2.5 MG tablet Take 1 tablet by mouth daily.   Melatonin 3 MG TABS Take 3 mg by mouth at bedtime.    mesalamine (LIALDA) 1.2 g EC tablet TAKE 2 TABLETS(2.4 GRAMS) BY MOUTH DAILY WITH BREAKFAST   norethindrone (NORLYDA) 0.35 MG tablet TAKE 1 TABLET(0.35 MG) BY MOUTH DAILY   nystatin (MYCOSTATIN/NYSTOP) powder Apply topically 3 (three) times daily. Apply to affected area for up to 7 days   olopatadine (PATANOL) 0.1 % ophthalmic solution Place 1  drop into both eyes 2 (two) times daily.   ondansetron (ZOFRAN) 4 MG tablet Take 1 tablet (4 mg total) by mouth every 8 (eight) hours as needed for nausea or vomiting.   predniSONE (DELTASONE) 10 MG tablet Take 4 tablets (40 mg total) by mouth daily with breakfast for 3 days, THEN 3 tablets (30 mg total) daily with breakfast for 3 days, THEN 2 tablets (20 mg total) daily with breakfast for 7 days, THEN 1 tablet (10 mg total) daily with breakfast for 7 days, THEN 0.5 tablets (5 mg total) daily with breakfast for 7 days.   saccharomyces boulardii (FLORASTOR) 250 MG capsule Take 250 mg by mouth 2 (two) times daily as needed for other. When on antibiotics    VITAMIN D PO Take 5,000 Units by mouth daily.   No facility-administered encounter medications on file as of 10/07/2022.    REVIEW OF SYSTEMS  : All other systems reviewed and negative except where noted in the History of Present Illness.   PHYSICAL EXAM: BP 120/74   Pulse (!) 104   Ht '5\' 3"'$  (1.6 m)   Wt 173 lb (78.5 kg)   BMI 30.65 kg/m  General: Well developed white female in no acute distress Head: Normocephalic and atraumatic Eyes:  Sclerae anicteric, conjunctiva pink. Ears: Normal auditory acuity Lungs: Clear throughout to auscultation; no W/R/R. Heart:  Slightly tachy but rhythm; no M/R/G. Abdomen: Soft, non-distended.  BS present.  Non-tender. Musculoskeletal: Symmetrical with no gross deformities  Skin: No lesions on visible extremities Extremities: No edema  Neurological: Alert oriented x 4, grossly non-focal Psychological:  Alert and cooperative. Normal mood and affect  ASSESSMENT AND PLAN: *Universal ulcerative colitis: Maintains on Lialda 2.4 g daily.  Recently had been placed on a prednisone taper for flare.  She is still on that and feels well now.  Will complete that taper.  Consider increasing her dosing to 4.8 g daily if she seems to have frequent flares or uncontrolled symptoms.  Also if she calls back with increase  in symptoms again then likely should do fecal calprotectin, C. difficile, etc.  She is due for colonoscopy in July.  Will bring her back in a few months to see Dr. Carlean Purl for follow-up at which time that can be scheduled.  Certainly she will call us back in the interim if she has any recurrent issues before then.  Prescription refill for Lialda sent to her pharmacy.  She would like to consider doing MiraLAX prep, but may be slow prep/extended over more time so she does not have to drink so much volume in such a short period of time.   CC:  Farrel Conners, MD   Primary GI MD  Agree w/ above plans - response to prednisone c/w this being a flare so  if recurs may not need check for C diff  Will reassess in May  Gatha Mayer, MD, [facility]

## 2022-11-24 ENCOUNTER — Telehealth: Payer: Federal, State, Local not specified - PPO | Admitting: Physician Assistant

## 2022-11-24 DIAGNOSIS — H00019 Hordeolum externum unspecified eye, unspecified eyelid: Secondary | ICD-10-CM

## 2022-11-24 MED ORDER — DOXYCYCLINE HYCLATE 100 MG PO TABS
100.0000 mg | ORAL_TABLET | Freq: Two times a day (BID) | ORAL | 0 refills | Status: DC
Start: 2022-11-24 — End: 2023-04-06

## 2022-11-24 MED ORDER — NEOMYCIN-POLYMYXIN-HC 3.5-10000-1 OP SUSP
3.0000 [drp] | Freq: Four times a day (QID) | OPHTHALMIC | 0 refills | Status: AC
Start: 2022-11-24 — End: 2022-12-01

## 2022-11-24 NOTE — Progress Notes (Signed)
  E-Visit for Stye   We are sorry that you are not feeling well. Here is how we plan to help!  Based on what you have shared with me it looks like you have a stye.  A stye is an inflammation of the eyelid.  It is often a red, painful lump near the edge of the eyelid that may look like a boil or a pimple.  A stye develops when an infection occurs at the base of an eyelash.   We have made appropriate suggestions for you based upon your presentation: Your symptoms may indicate an infection at site of stye. The use of anti-inflammatory and antibiotic eye drops for a week will help resolve this condition.  I have sent in neomycin-polymyxin HC opthalmic suspension, two to three drops in the affected eye every 4 hours.  Giving your history, I did put an oral antibiotic on file as well. If your symptoms do not improve over the next two to three days you should be seen in your doctor's office.  HOME CARE:  Wash your hands often! Let the stye open on its own. Don't squeeze or open it. Don't rub your eyes. This can irritate your eyes and let in bacteria.  If you need to touch your eyes, wash your hands first. Don't wear eye makeup or contact lenses until the area has healed.  GET HELP RIGHT AWAY IF:  Your symptoms do not improve. You develop blurred or loss of vision. Your symptoms worsen (increased discharge, pain or redness).   Thank you for choosing an e-visit.  Your e-visit answers were reviewed by a board certified advanced clinical practitioner to complete your personal care plan. Depending upon the condition, your plan could have included both over the counter or prescription medications.  Please review your pharmacy choice. Make sure the pharmacy is open so you can pick up prescription now. If there is a problem, you may contact your provider through [employer] and have the prescription routed to another pharmacy.  Your safety is important to [place]. If you have drug allergies check your  prescription carefully.   For the next 24 hours you can use MyChart to ask questions about today's visit, request a non-urgent call back, or ask for a work or school excuse. You will get an email in the next two days asking about your experience. I hope that your e-visit has been valuable and will speed your recovery.

## 2022-11-24 NOTE — Progress Notes (Signed)
I have spent 5 minutes in review of e-visit questionnaire, review and updating patient chart, medical decision making and response to patient.   Dayln Tugwell Cody Kenroy Timberman, PA-C    

## 2022-12-31 ENCOUNTER — Other Ambulatory Visit: Payer: Self-pay | Admitting: Internal Medicine

## 2023-01-05 ENCOUNTER — Encounter: Payer: Self-pay | Admitting: Internal Medicine

## 2023-01-05 ENCOUNTER — Other Ambulatory Visit (INDEPENDENT_AMBULATORY_CARE_PROVIDER_SITE_OTHER): Payer: Federal, State, Local not specified - PPO

## 2023-01-05 ENCOUNTER — Ambulatory Visit: Payer: Federal, State, Local not specified - PPO | Admitting: Internal Medicine

## 2023-01-05 VITALS — BP 134/80 | HR 87 | Ht 63.0 in

## 2023-01-05 DIAGNOSIS — K51018 Ulcerative (chronic) pancolitis with other complication: Secondary | ICD-10-CM

## 2023-01-05 LAB — CBC
HCT: 42.4 % (ref 36.0–46.0)
Hemoglobin: 14.2 g/dL (ref 12.0–15.0)
MCHC: 33.5 g/dL (ref 30.0–36.0)
MCV: 87.7 fl (ref 78.0–100.0)
Platelets: 284 10*3/uL (ref 150.0–400.0)
RBC: 4.83 Mil/uL (ref 3.87–5.11)
RDW: 12.9 % (ref 11.5–15.5)
WBC: 7.6 10*3/uL (ref 4.0–10.5)

## 2023-01-05 LAB — C-REACTIVE PROTEIN: CRP: <1 mg/dL (ref 0.5–20.0)

## 2023-01-05 LAB — SEDIMENTATION RATE: Sed Rate: 19 mm/hr (ref 0–30)

## 2023-01-05 NOTE — Progress Notes (Signed)
Elizabeth Huerta 51 y.o. 1971-01-28 [id number]  Assessment & Plan:   Encounter Diagnosis  Name Primary?   Universal ulcerative colitis with other complication (HCC) Yes    Recent flare early 2024 ? Stress but was not controlled 2022 colonoscopy (? From lapse in tx)  Evaluate as below - if signs of inflammation will go to 4.8 g Lialda and then do colonoscopy 8 weeks or so later  Orders Placed This Encounter  Procedures   CALPROTECTIN   CBC   C-reactive protein   Sed Rate (ESR)     Clen-piq prep in future to minimize risk of vomiting with prep   Subjective:   Chief Complaint: Ulcerative colitis  HPI 52 year old white woman with universal ulcerative colitis, treated with Lialda, who recently had a flare in early 2024.  It was at that time her father was dying and she was under quite a bit of stress.  She called in, I put her on prednisone taper she saw Elizabeth Mainland, PA-C and was scheduled for follow-up.  She was helped by the prednisone taper greatly and is having normal bowel movements and no active symptoms at this time.  When I had scoped her last in 2020 she had active disease, and she had been off her Lialda for a little while so I attributed to that.  She is concerned that the previous colonoscopy preparation with MiraLAX caused her to vomit and when she has another colonoscopy she wishes to pursue something different for the preparation.   The examined portion of the ileum was normal.                           - Mild (Mayo Score 1) pancolitis ulcerative                            colitis, improved since the last examination.                            Minimal inflammatory changes ascendinbg and                            transverse - slight erythema and aphthaeBiopsied.                            dx 2018                           - Diverticulosis in the sigmoid colon.                           - The examination was otherwise normal on direct                             and retroflexion views. 1. Surgical [P], colon, ascending and cecum - FOCALLY ACTIVE CHRONIC COLITIS, COMPATIBLE WITH PATIENT'S CLINICAL HISTORY OF INFLAMMATORY BOWEL DISEASE - NEGATIVE FOR GRANULOMAS OR LESION 2. Surgical [P], colon, transverse - COLONIC MUCOSA WITH NO SPECIFIC HISTOPATHOLOGIC CHANGES - NEGATIVE FOR ACUTE INFLAMMATION, FEATURES OF CHRONICITY, GRANULOMAS OR DYSPLASIA 3. Surgical [P], colon, sigmoid and descending - PATCHY, FOCALLY ACTIVE CHRONIC COLITIS, COMPATIBLE WITH PATIENT'S CLINICAL HISTORY OF INFLAMMATORY BOWEL DISEASE -  NEGATIVE FOR GRANULOMAS OR DYSPLESIA 4. Surgical [P], colon, rectum, distal sigmoid - COLONIC MUCOSA WITH NO SPECIFIC HISTOPATHOLOGIC CHANGES - NEGATIVE FOR ACUTE INFLAMMATION, FEATURES OF CHRONICITY, GRANULOMAS OR DYSPLASIA Holley Bouche MD Pathologist, Electronic Signature (Case signed 03/10/2021) Allergies  Allergen Reactions   Adhesive [Tape] Hives   Latex Rash   Current Meds  Medication Sig   cetirizine (ZYRTEC) 10 MG tablet Take 10 mg by mouth at bedtime.    clobetasol (TEMOVATE) 0.05 % external solution SMARTSIG:Topical 1 to 2 Times Daily PRN   clotrimazole (LOTRIMIN) 1 % cream Apply 1 application topically 2 (two) times daily. Use for 2 weeks.   fluticasone (FLONASE) 50 MCG/ACT nasal spray Place 2 sprays into both nostrils daily.   indapamide (LOZOL) 2.5 MG tablet Take 1 tablet by mouth daily.   Melatonin 3 MG TABS Take 3 mg by mouth at bedtime.    mesalamine (LIALDA) 1.2 g EC tablet TAKE 2 TABLETS(2.4 GRAMS) BY MOUTH DAILY WITH BREAKFAST   norethindrone (NORLYDA) 0.35 MG tablet TAKE 1 TABLET(0.35 MG) BY MOUTH DAILY   nystatin (MYCOSTATIN/NYSTOP) powder Apply topically 3 (three) times daily. Apply to affected area for up to 7 days   olopatadine (PATANOL) 0.1 % ophthalmic solution Place 1 drop into both eyes 2 (two) times daily.   ondansetron (ZOFRAN) 4 MG tablet Take 1 tablet (4 mg total) by mouth every 8 (eight) hours as needed  for nausea or vomiting.   saccharomyces boulardii (FLORASTOR) 250 MG capsule Take 250 mg by mouth 2 (two) times daily as needed for other. When on antibiotics    VITAMIN D PO Take 5,000 Units by mouth daily.   Past Medical History:  Diagnosis Date   Allergy    Anxiety 2003   treated   Basal cell carcinoma    nose   C. 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   Past Surgical History:  Procedure Laterality Date   BUNIONECTOMY WITH TARSAL MEDITARSAL FUSION Right 09/28/2017   CESAREAN SECTION  2001  &  2003   COLONOSCOPY  02/2017   ulcerative colitis   CYSTOSCOPY/RETROGRADE/URETEROSCOPY Right 09/28/2014   Procedure: CYSTOSCOPY RIGHT RETROGRADE RIGHT URETEROSCOPY;  Surgeon: Sebastian Ache, MD;  Location[facility];  Service: Urology;  Laterality: Right;   EXTRACORPOREAL SHOCK WAVE LITHOTRIPSY Bilateral left 05-15-2010/   right 07-03-2010   HOLMIUM LASER APPLICATION Right 09/28/2014   Procedure: HOLMIUM LASER OF STONES ;  Surgeon: Sebastian Ache, MD;  Location: [facility];  Service: Urology;  Laterality: Right;   MOHS SURGERY  2010   nasal area   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 Leone Payor   family history includes Alcohol abuse in her father, maternal grandfather, maternal grandmother, and mother; Asthma in her mother; COPD in her mother; Depression in her daughter, mother, and sister; Diabetes (age of onset: 4) in her father; Hearing loss in her father and mother; Heart attack (  age of onset: 39) in her paternal grandfather; Heart  attack (age of onset: 53) in her father; Heart disease in her father; Hyperlipidemia in her sister; Hypertension in her father and sister; Osteoarthritis in her father and mother; Skin cancer in her father and mother.   Review of Systems As per HPI  Objective:   Physical Exam @BP  134/80   Pulse 87   Ht 5\' 3"  (1.6 m)   BMI 30.65 kg/m @  General:  NAD Eyes:   anicteric Lungs:  clear Heart::  S1S2 no rubs, murmurs or gallops Abdomen:  soft and nontender, BS+ Ext:   no edema, cyanosis or clubbing    Data Reviewed:  As above

## 2023-01-05 NOTE — Patient Instructions (Addendum)
Please go to the lab and have the labs done and I will contact with results and plans.  Clen-piq is the prep we will use for future colonoscopy testing.  I appreciate the opportunity to care for you. Iva Boop, MD, [facility]

## 2023-01-06 DIAGNOSIS — L281 Prurigo nodularis: Secondary | ICD-10-CM | POA: Diagnosis not present

## 2023-01-06 DIAGNOSIS — Z85828 Personal history of other malignant neoplasm of skin: Secondary | ICD-10-CM | POA: Diagnosis not present

## 2023-01-06 DIAGNOSIS — L821 Other seborrheic keratosis: Secondary | ICD-10-CM | POA: Diagnosis not present

## 2023-01-06 DIAGNOSIS — D225 Melanocytic nevi of trunk: Secondary | ICD-10-CM | POA: Diagnosis not present

## 2023-01-15 ENCOUNTER — Ambulatory Visit: Payer: Federal, State, Local not specified - PPO

## 2023-01-15 DIAGNOSIS — K51018 Ulcerative (chronic) pancolitis with other complication: Secondary | ICD-10-CM

## 2023-01-21 LAB — CALPROTECTIN: Calprotectin: 226 mcg/g — ABNORMAL HIGH

## 2023-01-25 ENCOUNTER — Telehealth: Payer: Self-pay | Admitting: Internal Medicine

## 2023-01-25 DIAGNOSIS — K51018 Ulcerative (chronic) pancolitis with other complication: Secondary | ICD-10-CM

## 2023-01-25 DIAGNOSIS — K51919 Ulcerative colitis, unspecified with unspecified complications: Secondary | ICD-10-CM

## 2023-01-25 NOTE — Telephone Encounter (Signed)
I left her a message that because calprotectin test was abnormal need to go to mesalamine 2.4 g bid (up from qd)  OK to do 4.8 g qd if desired  I have pended the rx - it says it is not on formulary so please check that she is getting the medicine ok  I want her to do a colonoscopy about 8 weeks after making the med changes so schedule that also  Let me know if other issues

## 2023-01-26 ENCOUNTER — Other Ambulatory Visit: Payer: Self-pay

## 2023-01-26 DIAGNOSIS — K51018 Ulcerative (chronic) pancolitis with other complication: Secondary | ICD-10-CM

## 2023-01-26 DIAGNOSIS — K51919 Ulcerative colitis, unspecified with unspecified complications: Secondary | ICD-10-CM

## 2023-01-26 MED ORDER — CLENPIQ 10-3.5-12 MG-GM -GM/160ML PO SOLN
1.0000 | ORAL | 0 refills | Status: DC
Start: 2023-01-26 — End: 2023-04-06

## 2023-01-26 MED ORDER — MESALAMINE 1.2 G PO TBEC
2.4000 g | DELAYED_RELEASE_TABLET | Freq: Two times a day (BID) | ORAL | 3 refills | Status: DC
Start: 2023-01-26 — End: 2024-03-13

## 2023-01-26 NOTE — Telephone Encounter (Signed)
Pt made aware of recent results and Dr. Leone Payor recommendations: Prescription sent to pharmacy. Pt made aware.  Pt was scheduled for an Colonoscopy on 04/06/2023 at 8:00 AM in the [facility] with Dr. Leone Payor. Pt made aware.  Prep sent to pharmacy. Clen Piq per Dr. Leone Payor recent office note. Pt made aware Ambulatory referral to GI placed in Epic.  Pt verbalized understanding with all questions answered.

## 2023-02-25 DIAGNOSIS — H5213 Myopia, bilateral: Secondary | ICD-10-CM | POA: Diagnosis not present

## 2023-02-25 DIAGNOSIS — H43813 Vitreous degeneration, bilateral: Secondary | ICD-10-CM | POA: Diagnosis not present

## 2023-02-25 DIAGNOSIS — H04123 Dry eye syndrome of bilateral lacrimal glands: Secondary | ICD-10-CM | POA: Diagnosis not present

## 2023-03-25 ENCOUNTER — Encounter: Payer: Self-pay | Admitting: Internal Medicine

## 2023-04-06 ENCOUNTER — Encounter: Payer: Self-pay | Admitting: Internal Medicine

## 2023-04-06 ENCOUNTER — Ambulatory Visit (AMBULATORY_SURGERY_CENTER): Payer: Federal, State, Local not specified - PPO | Admitting: Internal Medicine

## 2023-04-06 VITALS — BP 113/68 | HR 56 | Temp 98.7°F | Resp 10 | Ht 63.0 in | Wt 174.2 lb

## 2023-04-06 DIAGNOSIS — K51919 Ulcerative colitis, unspecified with unspecified complications: Secondary | ICD-10-CM

## 2023-04-06 DIAGNOSIS — K529 Noninfective gastroenteritis and colitis, unspecified: Secondary | ICD-10-CM | POA: Diagnosis not present

## 2023-04-06 MED ORDER — SODIUM CHLORIDE 0.9 % IV SOLN
500.0000 mL | Freq: Once | INTRAVENOUS | Status: DC
Start: 2023-04-06 — End: 2023-04-06

## 2023-04-06 NOTE — Progress Notes (Signed)
Pt's states no medical or surgical changes since previsit or office visit. VS assessed by D.T 

## 2023-04-06 NOTE — Progress Notes (Signed)
Called to room to assist during endoscopic procedure.  Patient ID and intended procedure confirmed with present staff. Received instructions for my participation in the procedure from the performing physician.  

## 2023-04-06 NOTE — Op Note (Signed)
[name] Procedure Date: 04/06/2023 7:39 AM MRN: [id number] Endoscopist: Iva Boop , MD, [id number] Age: 52 Referring MD:  Date of Birth: 01/09/1971 Gender: Female Account #: [id number] Procedure:                Colonoscopy Indications:              High risk colon cancer surveillance: Ulcerative                            pancolitis of 8 (or more) years duration Medicines:                Monitored Anesthesia Care Procedure:                Pre-Anesthesia Assessment:                           - Prior to the procedure, a History and Physical                            was performed, and patient medications and                            allergies were reviewed. The patient's tolerance of                            previous anesthesia was also reviewed. The risks                            and benefits of the procedure and the sedation                            options and risks were discussed with the patient.                            All questions were answered, and informed consent                            was obtained. Prior Anticoagulants: The patient has                            taken no anticoagulant or antiplatelet agents. ASA                            Grade Assessment: II - A patient with mild systemic                            disease. After reviewing the risks and benefits,                            the patient was deemed in satisfactory condition to                            undergo the procedure.  After obtaining informed consent, the colonoscope                            was passed under direct vision. Throughout the                            procedure, the patient's blood pressure, pulse, and                            oxygen saturations were monitored continuously. The                            Olympus Scope SN: [id number] was introduced through                            the anus and advanced  to the the cecum, identified                            by appendiceal orifice and ileocecal valve. The                            colonoscopy was performed without difficulty. The                            patient tolerated the procedure well. The quality                            of the bowel preparation was adequate. The                            ileocecal valve, appendiceal orifice, and rectum                            were photographed. The bowel preparation used was                            Clenpiq via split dose instruction. Scope In: 8:07:47 AM Scope Out: 8:27:07 AM Scope Withdrawal Time: 0 hours 14 minutes 53 seconds  Total Procedure Duration: 0 hours 19 minutes 20 seconds  Findings:                 The perianal and digital rectal examinations were                            normal.                           Inflammation characterized by congestion (edema),                            erosions, granularity and aphthous ulcerations was                            found in a continuous and circumferential pattern  from the transverse colon to the cecum. The rectum,                            the sigmoid colon and the descending colon were                            spared. The inflammation was moderate in severity.                            Two biopsies were taken every 10 cm with a cold                            forceps from the entire colon for ulcerative                            colitis surveillance. These biopsy specimens from                            the 4 bottles were sent to Pathology. Verification                            of patient identification for the specimen was                            done. Estimated blood loss was minimal.                           A few diverticula were found in the sigmoid colon.                           Internal hemorrhoids were found.                           The exam was otherwise without abnormality  on                            direct and retroflexion views. Complications:            No immediate complications. Estimated Blood Loss:     Estimated blood loss was minimal. Impression:               - Pancolitis ulcerative colitis. Inflammation was                            found from the transverse colon to the cecum. This                            was moderate in severity. Biopsied.                           - Diverticulosis in the sigmoid colon.                           - Internal hemorrhoids.                           -  The examination was otherwise normal on direct                            and retroflexion views. Recommendation:           - Patient has a contact number available for                            emergencies. The signs and symptoms of potential                            delayed complications were discussed with the                            patient. Return to normal activities tomorrow.                            Written discharge instructions were provided to the                            patient.                           - Resume previous diet.                           - Continue present medications.                           - Currently on Lialda 2.4 g bid and not in                            remission - review path and decide next treatment.                            This is more severe than 02/2021 exam. Iva Boop, MD 04/06/2023 8:37:39 AM This report has been signed electronically.

## 2023-04-06 NOTE — Progress Notes (Signed)
Sedate, gd SR, tolerated procedure well, VSS, report to RN 

## 2023-04-06 NOTE — Patient Instructions (Addendum)
The colon is inflamed in about 50% of the area. I took biopsies and will discuss results and plans when able.   I appreciate the opportunity to care for you. Iva Boop, MD, [facility]   Handouts on hemorrhoids and diverticulosis given to patient. Await pathology results from biopsies taken  Resume previous diet and continue present medications    YOU HAD AN ENDOSCOPIC PROCEDURE TODAY AT THE [facility]:   Refer to the procedure report that was given to you for any specific questions about what was found during the examination.  If the procedure report does not answer your questions, please call your gastroenterologist to clarify.  If you requested that your care partner not be given the details of your procedure findings, then the procedure report has been included in a sealed envelope for you to review at your convenience later.  YOU SHOULD EXPECT: Some feelings of bloating in the abdomen. Passage of more gas than usual.  Walking can help get rid of the air that was put into your GI tract during the procedure and reduce the bloating. If you had a lower endoscopy (such as a colonoscopy or flexible sigmoidoscopy) you may notice spotting of blood in your stool or on the toilet paper. If you underwent a bowel prep for your procedure, you may not have a normal bowel movement for a few days.  Please Note:  You might notice some irritation and congestion in your nose or some drainage.  This is from the oxygen used during your procedure.  There is no need for concern and it should clear up in a day or so.  SYMPTOMS TO REPORT IMMEDIATELY:  Following lower endoscopy (colonoscopy or flexible sigmoidoscopy):  Excessive amounts of blood in the stool  Significant tenderness or worsening of abdominal pains  Swelling of the abdomen that is new, acute  Fever of 100F or higher  For urgent or emergent issues, a gastroenterologist can be reached at any hour by calling [phone]. Do not use  MyChart messaging for urgent concerns.    DIET:  We do recommend a small meal at first, but then you may proceed to your regular diet.  Drink plenty of fluids but you should avoid alcoholic beverages for 24 hours.  ACTIVITY:  You should plan to take it easy for the rest of today and you should NOT DRIVE or use heavy machinery until tomorrow (because of the sedation medicines used during the test).    FOLLOW UP: Our staff will call the number listed on your records the next business day following your procedure.  We will call around 7:15- 8:00 am to check on you and address any questions or concerns that you may have regarding the information given to you following your procedure. If we do not reach you, we will leave a message.     If any biopsies were taken you will be contacted by phone or by letter within the next 1-3 weeks.  Please call us at [phone] if you have not heard about the biopsies in 3 weeks.    SIGNATURES/CONFIDENTIALITY: You and/or your care partner have signed paperwork which will be entered into your electronic medical record.  These signatures attest to the fact that that the information above on your After Visit Summary has been reviewed and is understood.  Full responsibility of the confidentiality of this discharge information lies with you and/or your care-partner.

## 2023-04-06 NOTE — Progress Notes (Signed)
[facility] History and Physical   Primary Care Physician:  Karie Georges, MD   Reason for Procedure:   Ulcerative colitis  Plan:    colonoscopy     HPI: Elizabeth Huerta is a 52 y.o. female w/ universal UC here for surveillance exam   Past Medical History:  Diagnosis Date   Allergy    Anxiety 2003   treated   Basal cell carcinoma    nose   C. 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    Past Surgical History:  Procedure Laterality Date   BUNIONECTOMY WITH TARSAL MEDITARSAL FUSION Right 09/28/2017   CESAREAN SECTION  2001  &  2003   COLONOSCOPY  02/2017   ulcerative colitis   CYSTOSCOPY/RETROGRADE/URETEROSCOPY Right 09/28/2014   Procedure: CYSTOSCOPY RIGHT RETROGRADE RIGHT URETEROSCOPY;  Surgeon: Sebastian Ache, MD;  Location: [facility];  Service: Urology;  Laterality: Right;   EXTRACORPOREAL SHOCK WAVE LITHOTRIPSY Bilateral left 05-15-2010/   right 07-03-2010   HOLMIUM LASER APPLICATION Right 09/28/2014   Procedure: HOLMIUM LASER OF STONES ;  Surgeon: Sebastian Ache, MD;  Location: [facility];  Service: Urology;  Laterality: Right;   MOHS SURGERY  2010   nasal area   MOHS SURGERY  11/2018   nose and upper lip    Prior to Admission medications   Medication Sig Start Date End Date Taking? Authorizing Provider  cetirizine (ZYRTEC) 10 MG tablet Take 10 mg by mouth at bedtime.    Yes [provider]  fluticasone (FLONASE) 50 MCG/ACT nasal spray Place 2 sprays into both nostrils daily. 12/18/19  Yes Koberlein, Junell C, MD  indapamide (LOZOL) 2.5 MG tablet Take 1 tablet by mouth daily. 06/05/15  Yes [provider]  Melatonin 3 MG TABS Take 3 mg by mouth at bedtime.    Yes [provider]  mesalamine (LIALDA) 1.2 g EC tablet Take 2 tablets (2.4 g total) by mouth in the morning and at bedtime. 01/26/23  Yes Iva Boop, MD  norethindrone (NORLYDA) 0.35 MG tablet TAKE 1 TABLET(0.35 MG) BY MOUTH DAILY 06/15/22  Yes Amundson Shirley Friar, MD  ondansetron (ZOFRAN) 4 MG tablet Take 1 tablet (4 mg total) by mouth every 8 (eight) hours as needed for nausea or vomiting. 09/21/22  Yes Iva Boop, MD  VITAMIN D PO Take 5,000 Units by mouth daily.   Yes [provider]  clobetasol (TEMOVATE) 0.05 % external solution SMARTSIG:Topical 1 to 2 Times Daily PRN Patient not taking: Reported on 04/06/2023 01/10/20   [provider]  clotrimazole (LOTRIMIN) 1 % cream Apply 1 application topically 2 (two) times daily. Use for 2 weeks. Patient not taking: Reported on 04/06/2023 01/17/21   Patton Salles, MD  nystatin (MYCOSTATIN/NYSTOP) powder Apply topically 3 (three) times daily. Apply to affected area for up to 7 days Patient not taking: Reported on 04/06/2023 04/05/19   Patton Salles, MD  olopatadine (PATANOL) 0.1 % ophthalmic solution Place 1 drop into both eyes 2 (two) times daily. Patient not taking:  Reported on 04/06/2023 12/18/19   Wynn Banker, MD  saccharomyces boulardii (FLORASTOR) 250 MG capsule Take 250 mg by mouth 2 (two) times daily as needed for other. When on antibiotics  Patient not taking: Reported on 04/06/2023    [provider]    Current Outpatient Medications  Medication Sig Dispense Refill   cetirizine (ZYRTEC) 10 MG tablet Take 10 mg by mouth at bedtime.      fluticasone (FLONASE) 50 MCG/ACT nasal spray Place 2 sprays into both nostrils daily. 16 g 6   indapamide (LOZOL) 2.5 MG tablet Take 1 tablet by mouth daily.     Melatonin 3 MG TABS Take 3 mg by mouth at bedtime.      mesalamine (LIALDA) 1.2 g EC tablet Take 2 tablets (2.4 g  total) by mouth in the morning and at bedtime. 360 tablet 3   norethindrone (NORLYDA) 0.35 MG tablet TAKE 1 TABLET(0.35 MG) BY MOUTH DAILY 84 tablet 3   ondansetron (ZOFRAN) 4 MG tablet Take 1 tablet (4 mg total) by mouth every 8 (eight) hours as needed for nausea or vomiting. 30 tablet 1   VITAMIN D PO Take 5,000 Units by mouth daily.     clobetasol (TEMOVATE) 0.05 % external solution SMARTSIG:Topical 1 to 2 Times Daily PRN (Patient not taking: Reported on 04/06/2023)     clotrimazole (LOTRIMIN) 1 % cream Apply 1 application topically 2 (two) times daily. Use for 2 weeks. (Patient not taking: Reported on 04/06/2023) 30 g 0   nystatin (MYCOSTATIN/NYSTOP) powder Apply topically 3 (three) times daily. Apply to affected area for up to 7 days (Patient not taking: Reported on 04/06/2023) 30 g 2   olopatadine (PATANOL) 0.1 % ophthalmic solution Place 1 drop into both eyes 2 (two) times daily. (Patient not taking: Reported on 04/06/2023) 5 mL 12   saccharomyces boulardii (FLORASTOR) 250 MG capsule Take 250 mg by mouth 2 (two) times daily as needed for other. When on antibiotics  (Patient not taking: Reported on 04/06/2023)     Current Facility-Administered Medications  Medication Dose Route Frequency Provider Last Rate Last Admin   0.9 %  sodium chloride infusion  500 mL Intravenous Once Iva Boop, MD        Allergies as of 04/06/2023 - Review Complete 04/06/2023  Allergen Reaction Noted   Adhesive [tape] Hives 08/05/2016   Latex Rash 09/25/2014    Family History  Problem Relation Age of Onset   Asthma Mother    COPD Mother    Skin cancer Mother        basal cell   Alcohol abuse Mother    Osteoarthritis Mother    Depression Mother    Hearing loss Mother    Hypertension Father    Heart disease Father        stents   Skin cancer Father        basal cell   Osteoarthritis Father    Heart attack Father 63   Alcohol abuse Father    Diabetes Father 19   Hearing loss Father    Hypertension  Sister    Hyperlipidemia Sister    Depression Sister    Depression Daughter    Alcohol abuse Maternal Grandmother    Alcohol abuse Maternal Grandfather    Heart attack Paternal Grandfather 16   Colon cancer Neg Hx    Esophageal cancer Neg Hx    Pancreatic cancer Neg Hx    Stomach cancer Neg Hx    Rectal cancer  Neg Hx    Breast cancer Neg Hx     Social History   Socioeconomic History   Marital status: Married    Spouse name: Not on file   Number of children: 2   Years of education: 16   Highest education level: Not on file  Occupational History    Employer: [employer]   Tobacco Use   Smoking status: Former    Types: Cigarettes   Smokeless tobacco: Never   Tobacco comments:    minimal in college may 5-6 per year   Vaping Use   Vaping status: Never Used  Substance and Sexual Activity   Alcohol use: Yes    Alcohol/week: 2.0 standard drinks of alcohol    Types: 2 Standard drinks or equivalent per week   Drug use: No   Sexual activity: Yes    Partners: Male    Birth control/protection: Other-see comments, Pill    Comment: Husband had vasectomy  Other Topics Concern   Not on file  Social History Narrative   Married, 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 Leone Payor   Social Determinants of Health   Financial Resource Strain: Not on file  Food Insecurity: Not on file  Transportation Needs: Not on file  Physical Activity: Not on file  Stress: Not on file  Social Connections: Not on file  Intimate Partner Violence: Not on file    Review of Systems:  All other review of systems negative except as mentioned in the HPI.  Physical Exam: Vital signs BP 138/83   Pulse 72   Temp 98.7 F (37.1 C) (Skin)   Ht 5\' 3"  (1.6 m)   Wt 174 lb 3.2 oz (79 kg)   SpO2 97%   BMI 30.86 kg/m   General:   Alert,  Well-developed, well-nourished, pleasant and cooperative in NAD Lungs:  Clear throughout to auscultation.   Heart:  Regular rate and rhythm; no  murmurs, clicks, rubs,  or gallops. Abdomen:  Soft, nontender and nondistended. Normal bowel sounds.   Neuro/Psych:  Alert and cooperative. Normal mood and affect. A and O x 3   @Demarus Latterell  Sena Slate, MD, [facility] [phone] (pager) 04/06/2023 8:00 AM@

## 2023-04-07 ENCOUNTER — Telehealth: Payer: Self-pay | Admitting: *Deleted

## 2023-04-07 NOTE — Telephone Encounter (Signed)
  Follow up Call-     04/06/2023    7:21 AM 03/05/2021   10:14 AM  Call back number  Post procedure Call Back phone  # [phone]  Permission to leave phone message Yes Yes     Patient questions:  Do you have a fever, pain , or abdominal swelling? No. Pain Score  0 *  Have you tolerated food without any problems? Yes.    Have you been able to return to your normal activities? Yes.    Do you have any questions about your discharge instructions: Diet   No. Medications  No. Follow up visit  No.  Do you have questions or concerns about your Care? No.  Actions: * If pain score is 4 or above: No action needed, pain <4.

## 2023-04-14 ENCOUNTER — Telehealth: Payer: Self-pay | Admitting: Internal Medicine

## 2023-04-14 NOTE — Telephone Encounter (Signed)
Please let her know that the biopsies confirm active ulcerative colitis  She needs a follow-up with me - can use an 1130 or 350 spot or RN visit or banding spot  It is not urgent but let's try to see her by October

## 2023-04-15 NOTE — Telephone Encounter (Signed)
Spoke with patient regarding results & scheduled OV for 05/18/23 at 2:50 pm with Dr. Leone Payor. Advised she call back sooner with any concerns. Pt verbalized all understanding.

## 2023-05-17 ENCOUNTER — Other Ambulatory Visit: Payer: Self-pay | Admitting: Obstetrics and Gynecology

## 2023-05-17 DIAGNOSIS — Z01419 Encounter for gynecological examination (general) (routine) without abnormal findings: Secondary | ICD-10-CM

## 2023-05-18 ENCOUNTER — Ambulatory Visit: Payer: Federal, State, Local not specified - PPO | Admitting: Internal Medicine

## 2023-05-18 ENCOUNTER — Telehealth: Payer: Self-pay

## 2023-05-18 ENCOUNTER — Encounter: Payer: Self-pay | Admitting: Internal Medicine

## 2023-05-18 DIAGNOSIS — K51919 Ulcerative colitis, unspecified with unspecified complications: Secondary | ICD-10-CM

## 2023-05-18 NOTE — Telephone Encounter (Signed)
Medication refill request: Micronor  Last AEX:  06/15/22 Next AEX: 06/17/23  Last MMG (if hormonal medication request): n/a Refill authorized: #[id number] with 0 rf pended for today.

## 2023-05-18 NOTE — Telephone Encounter (Signed)
This encounter was created in error - please disregard.

## 2023-05-18 NOTE — Telephone Encounter (Signed)
Patient had to cancel her appointment today, family emergency. Please call her at [phone]. Thank you Sir.

## 2023-05-18 NOTE — Telephone Encounter (Signed)
Spoke to patient.  She had to leave town suddenly because her mother is ill and in the hospital in [place].  The patient is feeling well without symptoms of her ulcerative colitis but a colonoscopy earlier this year there was evidence of active disease and she had had a flare this year.  She is on mesalamine 2.4 g twice daily.  I explained how it looks like she is failing this therapy.  Since she feels well we have decided to repeat a calprotectin to see what level that is at this time and we will make further determination about treatment after that.  I did mention the possibility of treating with Humira or Rinvoq and had some discussion of potential side effects.  She will look into those as well.  Once the calprotectin level is back I will recontact her.

## 2023-06-03 NOTE — Progress Notes (Signed)
52 y.o. G55P2002 Married Caucasian female here for annual exam.    LMP was December, 2023.  On POPs.  Having some hot flashes and increased heat at night.  Sex is not painful.  Some stiff left shoulder.   May start tx for IBD.   PCP: Karie Georges, MD   No LMP recorded. Patient is perimenopausal.       Last cycle was 07/2022     Sexually active: Yes.    The current method of family planning is vasectomy/Micronor.    Exercising: Yes.     Walking treadmill M-F Smoker:  former  OB History  Gravida Para Term Preterm AB Living  2 2 2     2   SAB IAB Ectopic Multiple Live Births          2    # Outcome Date GA Lbr Len/2nd Weight Sex Type Anes PTL Lv  2 Term 12/28/01     CS-Unspec   LIV  1 Term 11/11/99     CS-Unspec   LIV     Health Maintenance: Pap:  11-01-17 Neg:Neg HR HPV, 04-19-13 Neg  History of abnormal Pap:  no MMG: Dx L 09/18/22 Breast Density Cat B, BI-RADS CAT 2 benign Colonoscopy:   HM Colonoscopy          Colonoscopy (Yearly) Next due on 04/05/2024    04/06/2023  COLONOSCOPY   Only the first 1 history entries have been loaded, but more history exists.           BMD:  n/a  Result  n/a  HIV: neg in preg Hep C: 04/10/20 neg  Immunization History  Administered Date(s) Administered   Influenza,inj,Quad PF,6+ Mos 05/04/2018, 06/15/2022   Influenza-Unspecified 06/27/2021   PFIZER(Purple Top)SARS-COV-2 Vaccination 09/25/2019, 10/20/2019, 07/05/2020   Pfizer Covid-19 Vaccine Bivalent Booster 74yrs & up 06/30/2021   Tdap 12/18/2019     reports that she has quit smoking. Her smoking use included cigarettes. She has never used smokeless tobacco. She reports current alcohol use of about 2.0 standard drinks of alcohol per week. She reports that she does not use drugs.  Past Medical History:  Diagnosis Date   Allergy    Anxiety 2003   treated   Basal cell carcinoma    nose   C. 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/25/2017    Past Surgical History:  Procedure Laterality Date   BUNIONECTOMY WITH TARSAL MEDITARSAL FUSION Right 09/28/2017   CESAREAN SECTION  2001  &  2003   COLONOSCOPY  02/2017   ulcerative colitis   CYSTOSCOPY/RETROGRADE/URETEROSCOPY Right 09/28/2014   Procedure: CYSTOSCOPY RIGHT RETROGRADE RIGHT URETEROSCOPY;  Surgeon: Sebastian Ache, MD;  Location: [facility];  Service: Urology;  Laterality: Right;   EXTRACORPOREAL SHOCK WAVE LITHOTRIPSY Bilateral left 05-15-2010/   right 07-03-2010   HOLMIUM LASER APPLICATION Right 09/28/2014   Procedure: HOLMIUM LASER OF STONES ;  Surgeon: Sebastian Ache, MD;  Location: [facility];  Service: Urology;  Laterality: Right;   MOHS SURGERY  2010   nasal area   MOHS SURGERY  11/2018   nose and upper lip    Current Outpatient Medications  Medication Sig Dispense Refill   cetirizine (ZYRTEC) 10 MG tablet Take 10 mg by mouth at bedtime.      clobetasol (TEMOVATE) 0.05 % external solution      clotrimazole (LOTRIMIN) 1 % cream Apply 1 application topically 2 (two) times daily. Use for 2 weeks. 30 g 0   fluticasone (FLONASE) 50 MCG/ACT nasal spray Place 2 sprays into both nostrils daily. 16 g 6   indapamide (LOZOL) 2.5 MG tablet Take 1 tablet by mouth daily.     Melatonin 3 MG TABS Take 3 mg by mouth at bedtime.      mesalamine (LIALDA) 1.2 g EC tablet Take 2 tablets (2.4 g total) by mouth in the morning and at bedtime. 360 tablet 3   norethindrone (MICRONOR) 0.35 MG tablet TAKE 1 TABLET(0.35 MG) BY MOUTH DAILY 84 tablet 0   nystatin (MYCOSTATIN/NYSTOP) powder Apply topically 3 (three) times daily. Apply to affected area for up to 7 days 30 g 2   olopatadine (PATANOL)  0.1 % ophthalmic solution Place 1 drop into both eyes 2 (two) times daily. 5 mL 12   ondansetron (ZOFRAN) 4 MG tablet Take 1 tablet (4 mg total) by mouth every 8 (eight) hours as needed for nausea or vomiting. 30 tablet 1   saccharomyces boulardii (FLORASTOR) 250 MG capsule Take 250 mg by mouth 2 (two) times daily as needed for other. When on antibiotics     VITAMIN D PO Take 5,000 Units by mouth daily.     No current facility-administered medications for this visit.    Family History  Problem Relation Age of Onset   Asthma Mother    COPD Mother    Skin cancer Mother        basal cell   Alcohol abuse Mother    Osteoarthritis Mother    Depression Mother    Hearing loss Mother    Hypertension Father    Heart disease Father        stents   Skin cancer Father        basal cell   Osteoarthritis Father    Heart attack Father 34   Alcohol abuse Father    Diabetes Father 76   Hearing loss Father    Hypertension Sister    Hyperlipidemia Sister    Depression Sister    Alcohol abuse Maternal Grandmother    Alcohol abuse Maternal Grandfather    Heart attack Paternal Grandfather 28   Depression Daughter    Colon cancer Neg Hx    Esophageal cancer Neg Hx    Pancreatic cancer Neg Hx    Stomach cancer Neg Hx    Rectal cancer Neg Hx    Breast cancer Neg Hx     Review of Systems  All other systems reviewed and are negative.   Exam:   BP 122/84 (BP Location: Right Arm, Patient Position: Sitting, Cuff Size: Normal)   Pulse 83   Ht 5\' 4"  (1.626 m)   Wt 173 lb (78.5 kg)   SpO2 99%   BMI 29.70 kg/m     General appearance: alert, cooperative and appears stated age Head: normocephalic, without obvious abnormality, atraumatic Neck: no adenopathy, supple, symmetrical, trachea midline and thyroid normal to inspection and palpation Lungs: clear to auscultation bilaterally Breasts: normal appearance, no masses or tenderness, No nipple retraction or dimpling, No nipple discharge or  bleeding, No axillary adenopathy Heart: regular rate and  rhythm Abdomen: soft, non-tender; no masses, no organomegaly Extremities: extremities normal, atraumatic, no cyanosis or edema Skin: skin color, texture, turgor normal. No rashes or lesions Lymph nodes: cervical, supraclavicular, and axillary nodes normal. Neurologic: grossly normal  Pelvic: External genitalia:  mild erythema of the lateral labia majora.               No abnormal inguinal nodes palpated.              Urethra:  normal appearing urethra with no masses, tenderness or lesions              Bartholins and Skenes: normal                 Vagina: normal appearing vagina with normal color and discharge, no lesions              Cervix: no lesions              Pap taken: yes Bimanual Exam:  Uterus:  normal size, contour, position, consistency, mobility, non-tender              Adnexa: no mass, fullness, tenderness              Rectal exam: yes.  Confirms.              Anus:  normal sphincter tone, no lesions  Chaperone was present for exam:  Warren Lacy, CMA   Assessment: Well woman with GYN exam.  Menorrhagia. Possible adenomyosis.  On POPs. Hx C diff colitis. Inflammatory bowel disease.  Hx renal stones.  HTN. Partner had vasectomy.   Plan: Mammogram screening discussed. Self breast awareness reviewed. Pap and HR HPV collected.  Guidelines for Calcium, Vitamin D, regular exercise program including cardiovascular and weight bearing exercise. Stop Micronor and will check FSH and E2 2 weeks later.  She will do her routine labs with her PCP.  Refill of Nystatin powder.  Try OTC hydrocortisone cream for vulva/thigh irritation.  Fu yearly and prn.

## 2023-06-14 ENCOUNTER — Other Ambulatory Visit: Payer: Self-pay | Admitting: Obstetrics and Gynecology

## 2023-06-14 DIAGNOSIS — Z0000 Encounter for general adult medical examination without abnormal findings: Secondary | ICD-10-CM

## 2023-06-17 ENCOUNTER — Other Ambulatory Visit (HOSPITAL_COMMUNITY)
Admission: RE | Admit: 2023-06-17 | Discharge: 2023-06-17 | Disposition: A | Discharge status: Home / Self Care | Payer: Federal, State, Local not specified - PPO | Source: Ambulatory Visit | Attending: Obstetrics and Gynecology | Admitting: Obstetrics and Gynecology

## 2023-06-17 ENCOUNTER — Ambulatory Visit (INDEPENDENT_AMBULATORY_CARE_PROVIDER_SITE_OTHER): Payer: Federal, State, Local not specified - PPO | Admitting: Obstetrics and Gynecology

## 2023-06-17 ENCOUNTER — Encounter: Payer: Self-pay | Admitting: Obstetrics and Gynecology

## 2023-06-17 VITALS — BP 122/84 | HR 83 | Ht 64.0 in | Wt 173.0 lb

## 2023-06-17 DIAGNOSIS — Z01419 Encounter for gynecological examination (general) (routine) without abnormal findings: Secondary | ICD-10-CM | POA: Diagnosis not present

## 2023-06-17 DIAGNOSIS — Z124 Encounter for screening for malignant neoplasm of cervix: Secondary | ICD-10-CM | POA: Insufficient documentation

## 2023-06-17 DIAGNOSIS — N951 Menopausal and female climacteric states: Secondary | ICD-10-CM | POA: Diagnosis not present

## 2023-06-17 MED ORDER — NYSTATIN 100000 UNIT/GM EX POWD: Freq: Three times a day (TID) | CUTANEOUS | 2 refills | Status: AC

## 2023-06-17 NOTE — Patient Instructions (Signed)

## 2023-06-18 LAB — CYTOLOGY - PAP
Adequacy: ABSENT
Comment: NEGATIVE
Diagnosis: NEGATIVE
High risk HPV: NEGATIVE

## 2023-07-03 ENCOUNTER — Other Ambulatory Visit: Payer: Self-pay | Admitting: Obstetrics and Gynecology

## 2023-07-03 DIAGNOSIS — N912 Amenorrhea, unspecified: Secondary | ICD-10-CM

## 2023-07-03 NOTE — Progress Notes (Signed)
New orders for future FSH and estradiol.

## 2023-07-09 ENCOUNTER — Other Ambulatory Visit: Payer: Self-pay

## 2023-07-09 ENCOUNTER — Telehealth: Payer: Self-pay | Admitting: Internal Medicine

## 2023-07-09 DIAGNOSIS — K51018 Ulcerative (chronic) pancolitis with other complication: Secondary | ICD-10-CM

## 2023-07-09 NOTE — Telephone Encounter (Signed)
Patient called and stated she had a conversation with Dr. Leone Payor in October and she also states that what she understood from that conversation was that he wanted her to take another stool Lab done. Patient is requesting a call back to get some clarity on the conversation. Please advise.

## 2023-07-09 NOTE — Telephone Encounter (Signed)
Spoke with patient & advised her that per phone note with Dr. Leone Payor 05/18/23 he did want her to complete a fecal calprotectin, and that she could come by to pick up kit at earliest convenience. She will come by this afternoon.

## 2023-07-26 ENCOUNTER — Ambulatory Visit: Payer: Federal, State, Local not specified - PPO

## 2023-07-29 DIAGNOSIS — M7542 Impingement syndrome of left shoulder: Secondary | ICD-10-CM | POA: Diagnosis not present

## 2023-07-30 ENCOUNTER — Other Ambulatory Visit: Payer: Federal, State, Local not specified - PPO

## 2023-07-30 DIAGNOSIS — K51018 Ulcerative (chronic) pancolitis with other complication: Secondary | ICD-10-CM

## 2023-08-06 LAB — CALPROTECTIN: Calprotectin: 1130 ug/g — ABNORMAL HIGH

## 2023-08-09 NOTE — Therapy (Signed)
OUTPATIENT PHYSICAL THERAPY SHOULDER EVALUATION   Patient Name: Elizabeth Huerta MRN: [id number] DOB:1971/02/04, 52 y.o., female Today's Date: 08/11/2023  END OF SESSION:  PT End of Session - 08/10/23 1116     Visit Number 1    Number of Visits 12    Date for PT Re-Evaluation 09/21/23    Authorization Type BC[employer]    PT Start Time 1110    PT Stop Time 1210    PT Time Calculation (min) 60 min    Activity Tolerance Patient tolerated treatment well    Behavior During Therapy [facility] for tasks assessed/performed             Past Medical History:  Diagnosis Date   Allergy    Anxiety 2003   treated   Basal cell carcinoma    nose   C. 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   Past Surgical History:  Procedure Laterality Date   BUNIONECTOMY WITH TARSAL MEDITARSAL FUSION Right 09/28/2017   CESAREAN SECTION  2001  &  2003   COLONOSCOPY  02/2017   ulcerative colitis   CYSTOSCOPY/RETROGRADE/URETEROSCOPY Right 09/28/2014   Procedure: CYSTOSCOPY RIGHT RETROGRADE RIGHT URETEROSCOPY;  Surgeon: Sebastian Ache, MD;  Location: [facility];  Service: Urology;  Laterality: Right;   EXTRACORPOREAL SHOCK WAVE LITHOTRIPSY Bilateral left 05-15-2010/   right 07-03-2010   HOLMIUM LASER APPLICATION Right 09/28/2014   Procedure: HOLMIUM LASER OF STONES ;  Surgeon: Sebastian Ache, MD;  Location: [facility];  Service: Urology;  Laterality: Right;   MOHS SURGERY  2010   nasal area   MOHS SURGERY  11/2018   nose and upper lip   Patient Active Problem List   Diagnosis Date Noted   Ulcerative colitis with complication (HCC) 10/07/2022   B12 deficiency 03/16/2022   Eczema  12/18/2019   Seborrheic dermatitis 12/18/2019   Nephrolithiasis 12/18/2019   Universal ulcerative colitis (HCC) 02/25/2017    PCP: Karie Georges, MD  REFERRING PROVIDER: Delfin Gant, MD   REFERRING DIAG: [id number] (ICD-10-CM) - Impingement syndrome of left shoulder   THERAPY DIAG:  Left shoulder pain, unspecified chronicity  Muscle weakness (generalized)  Stiffness of left shoulder, not elsewhere classified  Rationale for Evaluation and Treatment: Rehabilitation  ONSET DATE: early October 2024  SUBJECTIVE:  SUBJECTIVE STATEMENT: Pain began in early October with no specific MOI.  Pt saw MD on 07/29/23 and had x rays.  MD ordered PT and prescribed prednisone.  Pt states MD informed her that the bone is curved more downward.  Pt received a cortisone shot. She states it took 2.5 days to feel any improvement though since has been feeling improvement daily.  Pt reports improved mobility in shoulder including reaching her head since receiving the cortisone shot.  Pt has not taken prednisone. Pt states she did have some pain in her L sided neck which traveled down L UE.  Pt states she has not been having that pain for the last 4 days.    Pt is limited with reaching overhead and laterally.  Pt has pain with hair care.  Pt avoids certain activities including lifting milk with L UE and uses R UE.  Pt has increased pain with lifting heavy objects.  Pt has increased pain with trying to close the car door.  Pt unable to sleep on L shoulder.   Pt goes by "Penn"   Hand dominance: Left  PERTINENT HISTORY: Psoriasis Ulcerative colitis R Bunionectomy 2019    PAIN:  NPRS:  1/10 current, 5-6/10 worst, 1/10 best Location:  L lateral shoulder and proximal L UE Type:  dull ache   PRECAUTIONS: None  RED  FLAGS: None   WEIGHT BEARING RESTRICTIONS: No  FALLS:  Has patient fallen in last 6 months? 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  DIAGNOSTIC FINDINGS:  Pt had x rays though PT unable to view the results.  Pt states she has no fractures.    PATIENT SURVEYS:  FOTO 48 with a goal of 69 at visit 13  COGNITION: Overall cognitive status: Within functional limits for tasks assessed      UPPER EXTREMITY ROM:   AROM/PROM Right eval Left eval  Shoulder flexion 167 163 with 2/10 pain at end range  Shoulder scaption 173 150 with 2/10 pain at end range  Shoulder abduction 174 81 with 3/10 pain  Shoulder adduction    Shoulder internal rotation 76 78  Shoulder external rotation 79 66/72  Elbow flexion    Elbow extension    Wrist flexion    Wrist extension    Wrist ulnar deviation    Wrist radial deviation    Wrist pronation    Wrist supination    (Blank rows = not tested)  UPPER EXTREMITY MMT:  MMT Right eval Left eval  Shoulder flexion 5/5 4/5  Shoulder scaption 5/5 4/5  Shoulder abduction    Shoulder adduction    Shoulder internal rotation 5/5 5/5  Shoulder external rotation 5/5 Unable to tolerate, painful  Middle trapezius    Lower trapezius    Elbow flexion    Elbow extension    Wrist flexion    Wrist extension    Wrist ulnar deviation    Wrist radial deviation    Wrist pronation    Wrist supination    Grip strength (lbs)    (Blank rows = not tested)  SHOULDER SPECIAL TESTS: Impingement tests:  Neer's:  R: negative, L: positive  Hawkins-Kennedy:  R: negative, L: positive Crossover:  R: negative, L: slightly positive  ER lag test: negative  bilat   PALPATION:  Pt has no tenderness in palpation t/o L shoulder including on the acromion                                                                                                                             TREATMENT:  Pt performed supine serratus punches 2x10, supine shoulder ABC x 1 reps, and prone extension 2x10 reps.  Pt received a HEP handout and was educated in correct form and appropriate frequency.  PT instructed pt she should not have pain with HEP.     PATIENT EDUCATION: Education details: PT used a anatomical shoulder model to educate pt in dx, relevant anatomy, and biomechanics.  POC, HEP, exercise form, prognosis, and objective findings.  PT answered pt's questions.  Person educated: Patient Education method: Explanation, Demonstration, Tactile cues, Verbal cues, and Handouts Education comprehension: verbalized understanding, returned demonstration, verbal cues required, tactile cues required, and needs further education  HOME EXERCISE PROGRAM: Access Code: [email or web address] URL: [email or web address] Date: 08/10/2023 Prepared by: Aaron Edelman  Exercises - Supine Single Arm Shoulder Protraction  - 1 x daily - 7 x weekly - 2 sets - 10 reps - Supine Shoulder Alphabet  - 1-2 x daily - 7 x weekly - 1 reps - Prone Shoulder Extension - Single Arm  - 1 x daily - 7 x weekly - 2 sets - 10 reps  ASSESSMENT:  CLINICAL IMPRESSION: Patient is a 52 y.o. female with a dx of L shoulder impingement syndrome presenting to the clinic with L shoulder pain, limited ROM in L shoulder, and muscle weakness in L UE.  Pt reports improved sx's since receiving a cortisone injection.  Pt is limited with reaching overhead and laterally.  She has pain with hair care and is limited with lifting objects.  Pt is unable to sleep on L shoulder.  Objective findings are consistent with dx.  She has positive impingement testing of L shoulder.  Pt should benefit from skilled PT services to  address impairments and improve overall function.    OBJECTIVE IMPAIRMENTS: decreased activity tolerance, decreased ROM, decreased strength, hypomobility, impaired flexibility, impaired UE functional use, and pain.   ACTIVITY LIMITATIONS: carrying, lifting, reach over head, and hygiene/grooming  PARTICIPATION LIMITATIONS:   PERSONAL FACTORS: 1 comorbidity: psoriasis  are also affecting patient's functional outcome.   REHAB POTENTIAL: Good  CLINICAL DECISION MAKING: Stable/uncomplicated  EVALUATION COMPLEXITY: Low   GOALS:   SHORT TERM GOALS: Target date: 08/31/2023   Pt will be independent and compliant with HEP for improved pain, ROM, strength, and function. Baseline: Goal status: INITIAL  2.  Pt will demo improved L shoulder AROM to at least 160 deg in scaption and 105 deg in abduction for improved UE elevation and reaching. Baseline:  Goal status: INITIAL  3.  Pt will be able to perform hair care without difficulty and without significant pain.  Baseline:  Goal status: INITIAL    LONG TERM GOALS: Target date: 09/21/2023  Pt will report she is able to perform her normal reaching activities including overhead without significant pain and difficulty.  Baseline:  Goal status: INITIAL  2.  Pt will demo improved L shoulder strength to 5/5 MMT t/o for improved performance of reaching and functional lifting/carrying.  Baseline:  Goal status: INITIAL  3.  Pt will be able to perform her ADLs and IADLs without significant difficulty and pain.  Baseline:  Goal status: INITIAL   PLAN:  PT FREQUENCY: 2x/week  PT DURATION: 6 weeks  PLANNED INTERVENTIONS: 97164- PT Re-evaluation, 97110-Therapeutic exercises, 97530- Therapeutic activity, [id number]- Neuromuscular re-education, 97535- Self Care, 43329- Manual therapy, [id number]- Aquatic Therapy, 97014- Electrical stimulation (unattended), [id number]- Electrical stimulation (manual), [id number]- Ultrasound, Patient/Family education, Taping, Dry  Needling, Joint mobilization, Spinal mobilization, Cryotherapy, and Moist heat  PLAN FOR NEXT SESSION: review and perform HEP.  Scapular strengthening and stabiliziation.  Cont with manual therapy including STM and jt mobs to address mobility deficits and pain.     Audie Clear III PT, DPT 08/11/23 11:30 AM

## 2023-08-10 ENCOUNTER — Ambulatory Visit (HOSPITAL_BASED_OUTPATIENT_CLINIC_OR_DEPARTMENT_OTHER): Payer: Federal, State, Local not specified - PPO | Attending: Sports Medicine | Admitting: Physical Therapy

## 2023-08-10 ENCOUNTER — Other Ambulatory Visit: Payer: Self-pay

## 2023-08-10 DIAGNOSIS — M6281 Muscle weakness (generalized): Secondary | ICD-10-CM | POA: Insufficient documentation

## 2023-08-10 DIAGNOSIS — M25612 Stiffness of left shoulder, not elsewhere classified: Secondary | ICD-10-CM | POA: Diagnosis not present

## 2023-08-10 DIAGNOSIS — M25512 Pain in left shoulder: Secondary | ICD-10-CM | POA: Diagnosis not present

## 2023-08-11 ENCOUNTER — Encounter (HOSPITAL_BASED_OUTPATIENT_CLINIC_OR_DEPARTMENT_OTHER): Payer: Self-pay | Admitting: Physical Therapy

## 2023-08-19 ENCOUNTER — Ambulatory Visit: Payer: Federal, State, Local not specified - PPO

## 2023-08-19 DIAGNOSIS — N200 Calculus of kidney: Secondary | ICD-10-CM | POA: Diagnosis not present

## 2023-08-23 ENCOUNTER — Ambulatory Visit (HOSPITAL_BASED_OUTPATIENT_CLINIC_OR_DEPARTMENT_OTHER): Payer: Federal, State, Local not specified - PPO | Admitting: Physical Therapy

## 2023-08-23 ENCOUNTER — Encounter (HOSPITAL_BASED_OUTPATIENT_CLINIC_OR_DEPARTMENT_OTHER): Payer: Self-pay

## 2023-08-26 ENCOUNTER — Encounter: Payer: Self-pay | Admitting: Internal Medicine

## 2023-08-26 ENCOUNTER — Ambulatory Visit: Payer: Federal, State, Local not specified - PPO | Admitting: Internal Medicine

## 2023-08-26 VITALS — BP 122/80 | HR 80 | Ht 64.0 in | Wt 163.0 lb

## 2023-08-26 DIAGNOSIS — K51819 Other ulcerative colitis with unspecified complications: Secondary | ICD-10-CM

## 2023-08-26 DIAGNOSIS — K51018 Ulcerative (chronic) pancolitis with other complication: Secondary | ICD-10-CM

## 2023-08-26 NOTE — Patient Instructions (Signed)
 Stay on your current medicines.  COME IN EARLY APRIL AND HAVE LAB WORK AND TURN IN YOUR STOOL TEST. GO TODAY TO THE LAB AND PICK UP THE KIT.  Call us  if you have any signs or symptoms before you get really sick.  Get your Shingrix vaccine.   I appreciate the opportunity to care for you. Lupita Commander, MD, [facility]

## 2023-08-26 NOTE — Progress Notes (Signed)
 Elizabeth Huerta 52 y.o. [birth year] [id number]  Assessment & Plan:   Encounter Diagnosis  Name Primary?   Universal ulcerative colitis with other complication (HCC) Yes    The patient seems relatively asymptomatic.  This is a challenging situation, we reviewed the pros and cons of proceeding with biologic plus or minus immunomodulators therapy and potential side effects.  Though she is open to pursuing this and is aware of potential complications of untreated or inadequately treated ulcerative colitis she prefers to observe and repeat some lab tests and perhaps a colonoscopy.  We have planned to repeat CBC sed rate CRP and a fecal calprotectin in early April 2025.  She will continue her efforts regarding diet and minimizing and avoiding ultra processed foods which it sounds like she has done a good job with.  I have explained she needs to contact me if she think she is flaring so we can prevent a significant deterioration.  I have encouraged her to get the Shingrix vaccine.  She is aware we would need to do testing prior to biologic therapy that includes TB screening and hepatitis B screening.  She is also aware of the potential for immunomodulators treatment, she does have some history of basal cell cancer though I would anticipate only temporary 30-month treatment perhaps a year with an immunomodulators or.  Will look into the usefulness of HLA DQ A1 05 testing to predict antibodies to Biologics.  CC: Ozell Heron HERO, MD   Subjective:  Gastroenterology summary:  2016-C. difficile colitis -  required 2 courses of vancomycin   July 2018-Universal ulcerative colitis diagnosed at colonoscopy Mesalamine  (Lialda ) 2.4 g daily begun after initial steroid taper  July 2022 colonoscopy-transverse and ascending endoscopic colitis biopsies with patchy mild chronic colitis, on mesalamine  2.4 g daily since April 2022 (prior labs and treatment) February 2024 started to flare, stressful family issues  in the background May 2024 fecal calprotectin 226, normal sed rate and CRP mesalamine  increased to 4.8 g daily August 2024 colonoscopy endoscopic moderate colitis transverse to cecum biopsies with mild to moderate colitis in those areas plus descending and proximal sigmoid, distal sigmoid and rectum spared on mesalamine  4.8 g daily July 30, 2023 fecal calprotectin 1130  ----------------------------------------------------------------------------------------------- Chief Complaint: Ulcerative colitis follow-up  HPI 53 year old white woman with the above GI history, presents today to discuss treatment of her ulcerative colitis.  She says she feels well.  She did have norovirus on Christmas, with nausea vomiting and diarrhea.  However otherwise she has mostly formed bowel movements without urgency or diarrhea though there is occasional diarrhea.  There is no bleeding.  Diarrhea is described as loose stools.  She is losing weight reducing sugar and carbs and trying to eat whole foods and feels that has helped.  She has read about Rinvoq and Humira (adalimumab).  She prefers not to take Rinvoq due to potential side effects.  She is open to the idea of Humira or adalimumab.  She is also aware of the potential for immunomodulators like azathioprine or 6-MP to be used to reduce immunogenicity.  She intends to get vaccinated for shingles but has not done so.  She does not regularly take the flu vaccine because it makes her feel poorly.  She does not intend to take any more COVID vaccinations.  Continuing ongoing family stressors with 1 parent on either side of the family elderly and needing help.  Wt Readings from Last 3 Encounters:  08/26/23 163 lb (73.9 kg)  06/17/23 173 lb (  78.5 kg)  04/06/23 174 lb 3.2 oz (79 kg)    Allergies  Allergen Reactions   Adhesive [Tape] Hives   Latex Rash   Current Meds  Medication Sig   Calcium Carb-Cholecalciferol (LIQUID CALCIUM WITH D3) 600-25 MG-MCG CAPS Take  by mouth.   cetirizine (ZYRTEC) 10 MG tablet Take 10 mg by mouth at bedtime.    clobetasol (TEMOVATE) 0.05 % external solution    clotrimazole  (LOTRIMIN ) 1 % cream Apply 1 application topically 2 (two) times daily. Use for 2 weeks.   fluticasone  (FLONASE ) 50 MCG/ACT nasal spray Place 2 sprays into both nostrils daily.   indapamide (LOZOL) 2.5 MG tablet Take 1 tablet by mouth daily.   Melatonin 3 MG TABS Take 3 mg by mouth at bedtime.    mesalamine  (LIALDA ) 1.2 g EC tablet Take 2 tablets (2.4 g total) by mouth in the morning and at bedtime.   nystatin  (MYCOSTATIN /NYSTOP ) powder Apply topically 3 (three) times daily. Apply to affected area for up to 7 days   olopatadine  (PATANOL) 0.1 % ophthalmic solution Place 1 drop into both eyes 2 (two) times daily.   ondansetron  (ZOFRAN ) 4 MG tablet Take 1 tablet (4 mg total) by mouth every 8 (eight) hours as needed for nausea or vomiting.   saccharomyces boulardii (FLORASTOR) 250 MG capsule Take 250 mg by mouth 2 (two) times daily as needed for other. When on antibiotics   Turmeric (QC TUMERIC COMPLEX PO) Take by mouth.   VITAMIN D  PO Take 5,000 Units by mouth daily.   Past Medical History:  Diagnosis Date   Allergy    Anxiety 2003   treated   Basal cell carcinoma    nose   C. difficile colitis 06/20/2015   COVID 01/2021   Eczema    History of basal cell carcinoma excision    2010-- nasal area   Hypertension    Hypokalemia    Low vitamin B12 level 2021   Low vitamin D  level    Nephrolithiasis    LEFT   Renal cyst, right    Right ureteral stone    Shingles    Universal ulcerative colitis (HCC) 02/25/2017   Past Surgical History:  Procedure Laterality Date   BUNIONECTOMY WITH TARSAL MEDITARSAL FUSION Right 09/28/2017   CESAREAN SECTION  2001  &  2003   COLONOSCOPY  02/2017   ulcerative colitis   CYSTOSCOPY/RETROGRADE/URETEROSCOPY Right 09/28/2014   Procedure: CYSTOSCOPY RIGHT RETROGRADE RIGHT URETEROSCOPY;  Surgeon: Ricardo Likens, MD;   Location: [facility];  Service: Urology;  Laterality: Right;   EXTRACORPOREAL SHOCK WAVE LITHOTRIPSY Bilateral left 05-15-2010/   right 07-03-2010   HOLMIUM LASER APPLICATION Right 09/28/2014   Procedure: HOLMIUM LASER OF STONES ;  Surgeon: Ricardo Likens, MD;  Location: [facility];  Service: Urology;  Laterality: Right;   MOHS SURGERY  2010   nasal area   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 Avram   family history includes Alcohol abuse in her father, maternal grandfather, maternal grandmother, and mother; Asthma in her mother; COPD in her mother; Depression in her daughter, mother, and sister; Diabetes (age of onset: 59) in her father; Hearing loss in her father and mother; Heart attack (age of onset: 30) in her paternal grandfather; Heart attack (age of onset: 34) in her father; Heart  disease in her father; Hyperlipidemia in her sister; Hypertension in her father and sister; Osteoarthritis in her father and mother; Skin cancer in her father and mother.   Review of Systems As per HPI  Objective:   Physical Exam @BP  122/80   Pulse 80   Ht 5' 4 (1.626 m)   Wt 163 lb (73.9 kg)   BMI 27.98 kg/m @  General:  NAD Eyes:   anicteric Lungs:  clear Heart::  S1S2 no rubs, murmurs or gallops Abdomen:  soft and nontender, BS+ Ext:   no edema, cyanosis or clubbing    Data Reviewed:  See HPI and summary  49 minutes total time spent on this visit before during and after patient interaction.

## 2023-08-27 ENCOUNTER — Encounter (HOSPITAL_BASED_OUTPATIENT_CLINIC_OR_DEPARTMENT_OTHER): Payer: Self-pay

## 2023-08-27 ENCOUNTER — Ambulatory Visit (HOSPITAL_BASED_OUTPATIENT_CLINIC_OR_DEPARTMENT_OTHER): Payer: Federal, State, Local not specified - PPO | Attending: Sports Medicine

## 2023-08-27 DIAGNOSIS — M6281 Muscle weakness (generalized): Secondary | ICD-10-CM | POA: Diagnosis not present

## 2023-08-27 DIAGNOSIS — M25612 Stiffness of left shoulder, not elsewhere classified: Secondary | ICD-10-CM | POA: Insufficient documentation

## 2023-08-27 DIAGNOSIS — M25512 Pain in left shoulder: Secondary | ICD-10-CM | POA: Diagnosis not present

## 2023-08-27 NOTE — Therapy (Addendum)
 OUTPATIENT PHYSICAL THERAPY SHOULDER TREATMENT   Patient Name: Elizabeth Huerta MRN: [id number] DOB:1971-07-31, 53 y.o., female Today's Date: 08/27/2023  END OF SESSION:  PT End of Session - 08/27/23 0844     Visit Number 2    Number of Visits 12    Date for PT Re-Evaluation 09/21/23    Authorization Type BCBS    PT Start Time 0850    PT Stop Time 0930    PT Time Calculation (min) 40 min    Activity Tolerance Patient tolerated treatment well    Behavior During Therapy [facility] for tasks assessed/performed              Past Medical History:  Diagnosis Date   Allergy    Anxiety 2003   treated   Basal cell carcinoma    nose   C. difficile colitis 06/20/2015   COVID 01/2021   Eczema    History of basal cell carcinoma excision    2010-- nasal area   Hypertension    Hypokalemia    Low vitamin B12 level 2021   Low vitamin D  level    Nephrolithiasis    LEFT   Renal cyst, right    Right ureteral stone    Shingles    Universal ulcerative colitis (HCC) 02/25/2017   Past Surgical History:  Procedure Laterality Date   BUNIONECTOMY WITH TARSAL MEDITARSAL FUSION Right 09/28/2017   CESAREAN SECTION  2001  &  2003   COLONOSCOPY  02/2017   ulcerative colitis   CYSTOSCOPY/RETROGRADE/URETEROSCOPY Right 09/28/2014   Procedure: CYSTOSCOPY RIGHT RETROGRADE RIGHT URETEROSCOPY;  Surgeon: Ricardo Likens, MD;  Location: [facility];  Service: Urology;  Laterality: Right;   EXTRACORPOREAL SHOCK WAVE LITHOTRIPSY Bilateral left 05-15-2010/   right 07-03-2010   HOLMIUM LASER APPLICATION Right 09/28/2014   Procedure: HOLMIUM LASER OF STONES ;  Surgeon: Ricardo Likens, MD;  Location: [facility];  Service: Urology;  Laterality: Right;   MOHS SURGERY  2010   nasal area   MOHS SURGERY  11/2018   nose and upper lip   Patient Active Problem List   Diagnosis Date Noted   Ulcerative colitis with complication (HCC) 10/07/2022   B12 deficiency 03/16/2022   Eczema  12/18/2019   Seborrheic dermatitis 12/18/2019   Nephrolithiasis 12/18/2019   Universal ulcerative colitis (HCC) 02/25/2017    PCP: Ozell Heron HERO, MD  REFERRING PROVIDER: Arnaldo Juliene RAMAN, MD   REFERRING DIAG: [phone] (ICD-10-CM) - Impingement syndrome of left shoulder   THERAPY DIAG:  Left shoulder pain, unspecified chronicity  Muscle weakness (generalized)  Stiffness of left shoulder, not elsewhere classified  Rationale for Evaluation and Treatment: Rehabilitation  ONSET DATE: early October 2024  SUBJECTIVE:  SUBJECTIVE STATEMENT:   Pt reports no improvement yet in shoulder. Minimal pain at rest, but has 3-4/10 pain level with reaching. Unable to reach as well as she used to. She also has pain with sleeping.  Eval:  Pain began in early October with no specific MOI.  Pt saw MD on 07/29/23 and had x rays.  MD ordered PT and prescribed prednisone .  Pt states MD informed her that the bone is curved more downward.  Pt received a cortisone shot. She states it took 2.5 days to feel any improvement though since has been feeling improvement daily.  Pt reports improved mobility in shoulder including reaching her head since receiving the cortisone shot.  Pt has not taken prednisone . Pt states she did have some pain in her L sided neck which traveled down L UE.  Pt states she has not been having that pain for the last 4 days.    Pt is limited with reaching overhead and laterally.  Pt has pain with hair care.  Pt avoids certain activities including lifting milk with L UE and uses R UE.  Pt has increased pain with lifting heavy objects.  Pt has increased pain with trying to close the car door.  Pt unable to sleep on L shoulder.   Pt goes by [employer]   Hand dominance: Left  PERTINENT  HISTORY: Psoriasis Ulcerative colitis R Bunionectomy 2019    PAIN:  NPRS:  1/10 current, 5-6/10 worst, 1/10 best Location:  L lateral shoulder and proximal L UE Type:  dull ache   PRECAUTIONS: None  RED FLAGS: None   WEIGHT BEARING RESTRICTIONS: No  FALLS:  Has patient fallen in last 6 months? 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  DIAGNOSTIC FINDINGS:  Pt had x rays though PT unable to view the results.  Pt states she has no fractures.    PATIENT SURVEYS:  FOTO 48 with a goal of 69 at visit 13  COGNITION: Overall cognitive status: Within functional limits for tasks assessed      UPPER EXTREMITY ROM:   AROM/PROM Right eval Left eval  Shoulder flexion 167 163 with 2/10 pain at end range  Shoulder scaption 173 150 with 2/10 pain at end range  Shoulder abduction 174 81 with 3/10 pain  Shoulder adduction    Shoulder internal rotation 76 78  Shoulder external rotation 79 66/72  Elbow flexion    Elbow extension    Wrist flexion    Wrist extension    Wrist ulnar deviation    Wrist radial deviation    Wrist pronation    Wrist supination    (Blank rows = not tested)  UPPER EXTREMITY MMT:  MMT Right eval Left eval  Shoulder flexion 5/5 4/5  Shoulder scaption 5/5 4/5  Shoulder abduction    Shoulder adduction    Shoulder internal rotation 5/5 5/5  Shoulder external rotation 5/5 Unable to tolerate, painful  Middle trapezius    Lower trapezius    Elbow flexion    Elbow extension    Wrist flexion    Wrist extension    Wrist ulnar deviation    Wrist radial deviation    Wrist pronation  Wrist supination    Grip strength (lbs)    (Blank rows = not  tested)  SHOULDER SPECIAL TESTS: Impingement tests:  Neer's:  R: negative, L: positive  Hawkins-Kennedy:  R: negative, L: positive Crossover:  R: negative, L: slightly positive  ER lag test: negative bilat   PALPATION:  Pt has no tenderness in palpation t/o L shoulder including on the acromion                                                                                                                             TREATMENT:  STM/TPR to cervical paraspinals and peri scapular mm Seated scapular retraction UT and levator stretching Posterior shoulder rolls Bilateral ER seated red 2x10 Horizontal abd seated red 2x10       PATIENT EDUCATION: Education details: PT used a anatomical shoulder model to educate pt in dx, relevant anatomy, and biomechanics.  POC, HEP, exercise form, prognosis, and objective findings.  PT answered pt's questions.  Person educated: Patient Education method: Explanation, Demonstration, Tactile cues, Verbal cues, and Handouts Education comprehension: verbalized understanding, returned demonstration, verbal cues required, tactile cues required, and needs further education  HOME EXERCISE PROGRAM: Access Code: [email or web address] URL: [email or web address] Date: 08/10/2023 Prepared by: Mose Minerva  Exercises - Supine Single Arm Shoulder Protraction  - 1 x daily - 7 x weekly - 2 sets - 10 reps - Supine Shoulder Alphabet  - 1-2 x daily - 7 x weekly - 1 reps - Prone Shoulder Extension - Single Arm  - 1 x daily - 7 x weekly - 2 sets - 10 reps  ASSESSMENT:  CLINICAL IMPRESSION:  Pt demonstrates significant tightness within scapular and cervical muscles. She felt relief following TPR and STM to the area. Educated pt on postural awareness, especially when sitting at the computer. Pt tends to sit with rounded and elevated shoulders. Updated HEP to include some seated exercises she can perform during the work day.   Eval:  Patient is a 53 y.o. female  with a dx of L shoulder impingement syndrome presenting to the clinic with L shoulder pain, limited ROM in L shoulder, and muscle weakness in L UE.  Pt reports improved sx's since receiving a cortisone injection.  Pt is limited with reaching overhead and laterally.  She has pain with hair care and is limited with lifting objects.  Pt is unable to sleep on L shoulder.  Objective findings are consistent with dx.  She has positive impingement testing of L shoulder.  Pt should benefit from skilled PT services to address impairments and improve overall function.    OBJECTIVE IMPAIRMENTS: decreased activity tolerance, decreased ROM, decreased strength, hypomobility, impaired flexibility, impaired UE functional use, and pain.   ACTIVITY LIMITATIONS: carrying, lifting, reach over head, and hygiene/grooming  PARTICIPATION LIMITATIONS:   PERSONAL FACTORS: 1 comorbidity: psoriasis  are also affecting patient's functional outcome.   REHAB POTENTIAL: Good  CLINICAL DECISION MAKING: Stable/uncomplicated  EVALUATION COMPLEXITY: Low  GOALS:   SHORT TERM GOALS: Target date: 08/31/2023   Pt will be independent and compliant with HEP for improved pain, ROM, strength, and function. Baseline: Goal status: INITIAL  2.  Pt will demo improved L shoulder AROM to at least 160 deg in scaption and 105 deg in abduction for improved UE elevation and reaching. Baseline:  Goal status: INITIAL  3.  Pt will be able to perform hair care without difficulty and without significant pain.  Baseline:  Goal status: INITIAL    LONG TERM GOALS: Target date: 09/21/2023  Pt will report she is able to perform her normal reaching activities including overhead without significant pain and difficulty.  Baseline:  Goal status: INITIAL  2.  Pt will demo improved L shoulder strength to 5/5 MMT t/o for improved performance of reaching and functional lifting/carrying.  Baseline:  Goal status: INITIAL  3.  Pt will be able to  perform her ADLs and IADLs without significant difficulty and pain.  Baseline:  Goal status: INITIAL   PLAN:  PT FREQUENCY: 2x/week  PT DURATION: 6 weeks  PLANNED INTERVENTIONS: 97164- PT Re-evaluation, 97110-Therapeutic exercises, 97530- Therapeutic activity, [id number]- Neuromuscular re-education, 97535- Self Care, [id number]- Manual therapy, [id number]- Aquatic Therapy, 97014- Electrical stimulation (unattended), [id number]- Electrical stimulation (manual), [id number]- Ultrasound, Patient/Family education, Taping, Dry Needling, Joint mobilization, Spinal mobilization, Cryotherapy, and Moist heat  PLAN FOR NEXT SESSION: review and perform HEP.  Scapular strengthening and stabiliziation.  Cont with manual therapy including STM and jt mobs to address mobility deficits and pain.     Asberry Rodes, PTA  08/27/23 10:06 AM

## 2023-09-01 ENCOUNTER — Encounter (HOSPITAL_BASED_OUTPATIENT_CLINIC_OR_DEPARTMENT_OTHER): Payer: Self-pay | Admitting: Physical Therapy

## 2023-09-01 ENCOUNTER — Ambulatory Visit (HOSPITAL_BASED_OUTPATIENT_CLINIC_OR_DEPARTMENT_OTHER): Payer: Federal, State, Local not specified - PPO | Admitting: Physical Therapy

## 2023-09-01 DIAGNOSIS — M6281 Muscle weakness (generalized): Secondary | ICD-10-CM

## 2023-09-01 DIAGNOSIS — M25512 Pain in left shoulder: Secondary | ICD-10-CM | POA: Diagnosis not present

## 2023-09-01 DIAGNOSIS — M25612 Stiffness of left shoulder, not elsewhere classified: Secondary | ICD-10-CM | POA: Diagnosis not present

## 2023-09-01 NOTE — Therapy (Signed)
 OUTPATIENT PHYSICAL THERAPY SHOULDER TREATMENT   Patient Name: Elizabeth Huerta MRN: [id number] DOB:1971-07-16, 53 y.o., female Today's Date: 09/01/2023  END OF SESSION:  PT End of Session - 09/01/23 0902     Visit Number 3    Number of Visits 12    Date for PT Re-Evaluation 09/21/23    Authorization Type BCBS    PT Start Time 0900    PT Stop Time 0945    PT Time Calculation (min) 45 min    Activity Tolerance Patient tolerated treatment well    Behavior During Therapy [facility] for tasks assessed/performed              Past Medical History:  Diagnosis Date   Allergy    Anxiety 2003   treated   Basal cell carcinoma    nose   C. difficile colitis 06/20/2015   COVID 01/2021   Eczema    History of basal cell carcinoma excision    2010-- nasal area   Hypertension    Hypokalemia    Low vitamin B12 level 2021   Low vitamin D  level    Nephrolithiasis    LEFT   Renal cyst, right    Right ureteral stone    Shingles    Universal ulcerative colitis (HCC) 02/25/2017   Past Surgical History:  Procedure Laterality Date   BUNIONECTOMY WITH TARSAL MEDITARSAL FUSION Right 09/28/2017   CESAREAN SECTION  2001  &  2003   COLONOSCOPY  02/2017   ulcerative colitis   CYSTOSCOPY/RETROGRADE/URETEROSCOPY Right 09/28/2014   Procedure: CYSTOSCOPY RIGHT RETROGRADE RIGHT URETEROSCOPY;  Surgeon: Osborn Blaze, MD;  Location[facility];  Service: Urology;  Laterality: Right;   EXTRACORPOREAL SHOCK WAVE LITHOTRIPSY Bilateral left 05-15-2010/   right 07-03-2010   HOLMIUM LASER APPLICATION Right 09/28/2014   Procedure: HOLMIUM LASER OF STONES ;  Surgeon: Osborn Blaze, MD;  Location: [facility];  Service: Urology;  Laterality: Right;   MOHS SURGERY  2010   nasal area   MOHS SURGERY  11/2018   nose and upper lip   Patient Active Problem List   Diagnosis Date Noted   Ulcerative colitis with complication (HCC) 10/07/2022   B12 deficiency 03/16/2022   Eczema  12/18/2019   Seborrheic dermatitis 12/18/2019   Nephrolithiasis 12/18/2019   Universal ulcerative colitis (HCC) 02/25/2017    PCP: Aida House, MD  REFERRING PROVIDER: Stafford Eagles, MD   REFERRING DIAG: [phone] (ICD-10-CM) - Impingement syndrome of left shoulder   THERAPY DIAG:  Left shoulder pain, unspecified chronicity  Muscle weakness (generalized)  Stiffness of left shoulder, not elsewhere classified  Rationale for Evaluation and Treatment: Rehabilitation  ONSET DATE: early October 2024  SUBJECTIVE:  SUBJECTIVE STATEMENT:  Pt reports some improvement in L shoulder and neck.  She reports compliance with HEP.   Eval: Pain began in early October with no specific MOI.  Pt saw MD on 07/29/23 and had x rays.  MD ordered PT and prescribed prednisone .  Pt states MD informed her that the bone is curved more downward.  Pt received a cortisone shot. She states it took 2.5 days to feel any improvement though since has been feeling improvement daily.  Pt reports improved mobility in shoulder including reaching her head since receiving the cortisone shot.  Pt has not taken prednisone . Pt states she did have some pain in her L sided neck which traveled down L UE.  Pt states she has not been having that pain for the last 4 days.    Pt is limited with reaching overhead and laterally.  Pt has pain with hair care.  Pt avoids certain activities including lifting milk with L UE and uses R UE.  Pt has increased pain with lifting heavy objects.  Pt has increased pain with trying to close the car door.  Pt unable to sleep on L shoulder.   Hand dominance: Left  PERTINENT HISTORY: Psoriasis Ulcerative colitis R Bunionectomy 2019    PAIN:  NPRS:  1/10 current, Location:  L neck  Type:  dull ache    PRECAUTIONS: None  RED FLAGS: None   WEIGHT BEARING RESTRICTIONS: No  FALLS:  Has patient fallen in last 6 months? 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  DIAGNOSTIC FINDINGS:  Pt had x rays though PT unable to view the results.  Pt states she has no fractures.    PATIENT SURVEYS:  FOTO 48 with a goal of 69 at visit 13  COGNITION: Overall cognitive status: Within functional limits for tasks assessed      UPPER EXTREMITY ROM:   AROM/PROM Right eval Left eval  Shoulder flexion 167 163 with 2/10 pain at end range  Shoulder scaption 173 150 with 2/10 pain at end range  Shoulder abduction 174 81 with 3/10 pain  Shoulder adduction    Shoulder internal rotation 76 78  Shoulder external rotation 79 66/72  Elbow flexion    Elbow extension    Wrist flexion    Wrist extension    Wrist ulnar deviation    Wrist radial deviation    Wrist pronation    Wrist supination    (Blank rows = not tested)  UPPER EXTREMITY MMT:  MMT Right eval Left eval  Shoulder flexion 5/5 4/5  Shoulder scaption 5/5 4/5  Shoulder abduction    Shoulder adduction    Shoulder internal rotation 5/5 5/5  Shoulder external rotation 5/5 Unable to tolerate, painful  Middle trapezius    Lower trapezius    Elbow flexion    Elbow extension    Wrist flexion    Wrist extension    Wrist ulnar deviation    Wrist radial deviation    Wrist pronation    Wrist supination    Grip strength (lbs)    (Blank rows = not tested)  SHOULDER SPECIAL TESTS: Impingement tests:  Neer's:  R: negative, L: positive  Hawkins-Kennedy:  R: negative, L: positive Crossover:  R: negative, L: slightly  positive  ER lag test: negative bilat   PALPATION:  Pt has no tenderness in palpation t/o L shoulder including on the acromion                                                                                                                             TREATMENT: * UBE L1: 1 min forward/ 1 min backward  * L pec stretch, 10-15 sec - low, middle, high * Hooklying on full foam roller:  snow angels x 8, range to tolerance; bilat shoulder flex "touch down";  bilat horiz abdct/ addct x 8; L nerve glides x 10 * open book x 5 each side * star gazer stretch (hands behind head in hooklying) x 15s * L protraction x 5 * standing w's x 5 sec x 5 * bilat shoulder flex stretch at desk x 15s  * manual therapy: STM to L pec maj, latissimus. PROM into L shoulder ext, pinning scapula for stretch to teres maj/min tricep * self care: pt instructed in self massage with ball on wall to pec and periscapular musculature  PATIENT EDUCATION: Education details:  HEP, exercise form, anatomy  Person educated: Patient Education method: Explanation, Demonstration, Tactile cues, Verbal cues, Education comprehension: verbalized understanding, returned demonstration, verbal cues required, tactile cues required, and needs further education  HOME EXERCISE PROGRAM: Access Code: [email or web address] URL: [email or web address] Date: 09/01/2023 Prepared by: [facility] [address]  Exercises - Supine Single Arm Shoulder Protraction  - 1 x daily - 7 x weekly - 2 sets - 10 reps - Supine Shoulder Alphabet  - 1-2 x daily - 7 x weekly - 1 reps - Seated Scapular Retraction  - 1-3 x daily - 7 x weekly - 2-3 sets - 10 reps - 2-3seconds hold - Standing Backward Shoulder Rolls  - 1-3 x daily - 7 x weekly - 2-3 sets - 10 reps - Shoulder External Rotation and Scapular Retraction  - 1-2 x daily - 7 x weekly - 1-2 sets - 10 reps - Seated Upper Trapezius Stretch  - 1-2 x daily - 7 x weekly - 3 sets - 30seconds  hold - Gentle Levator Scapulae Stretch  - 1-2 x daily - 7 x weekly - 3 sets - 30seconds hold - Single Arm Doorway Pec Stretch at 120 Degrees Abduction  - 2 x daily - 7 x weekly - 2-3 reps - 10-15 hold - Snow Angels on Foam Roll  - 1 x daily - 3-4 x weekly - 1 sets - 10 reps  ASSESSMENT:  CLINICAL IMPRESSION:  Pt tolerated all exercises well, with only minor increase in discomfort in L pec/ shoulder girdle during stretches. Updated HEP to include pec stretches; advised to pick one each day.  Goals are ongoing.  Therapist to check STG next visit.   Eval: Patient is a 53 y.o. female with a dx of L  shoulder impingement syndrome presenting to the clinic with L shoulder pain, limited ROM in L shoulder, and muscle weakness in L UE.  Pt reports improved sx's since receiving a cortisone injection.  Pt is limited with reaching overhead and laterally.  She has pain with hair care and is limited with lifting objects.  Pt is unable to sleep on L shoulder.  Objective findings are consistent with dx.  She has positive impingement testing of L shoulder.  Pt should benefit from skilled PT services to address impairments and improve overall function.    OBJECTIVE IMPAIRMENTS: decreased activity tolerance, decreased ROM, decreased strength, hypomobility, impaired flexibility, impaired UE functional use, and pain.   ACTIVITY LIMITATIONS: carrying, lifting, reach over head, and hygiene/grooming  PARTICIPATION LIMITATIONS:   PERSONAL FACTORS: 1 comorbidity: psoriasis  are also affecting patient's functional outcome.   REHAB POTENTIAL: Good  CLINICAL DECISION MAKING: Stable/uncomplicated  EVALUATION COMPLEXITY: Low   GOALS:   SHORT TERM GOALS: Target date: 08/31/2023   Pt will be independent and compliant with HEP for improved pain, ROM, strength, and function. Baseline: Goal status: INITIAL  2.  Pt will demo improved L shoulder AROM to at least 160 deg in scaption and 105 deg in abduction for  improved UE elevation and reaching. Baseline:  Goal status: INITIAL  3.  Pt will be able to perform hair care without difficulty and without significant pain.  Baseline:  Goal status: INITIAL    LONG TERM GOALS: Target date: 09/21/2023  Pt will report she is able to perform her normal reaching activities including overhead without significant pain and difficulty.  Baseline:  Goal status: INITIAL  2.  Pt will demo improved L shoulder strength to 5/5 MMT t/o for improved performance of reaching and functional lifting/carrying.  Baseline:  Goal status: INITIAL  3.  Pt will be able to perform her ADLs and IADLs without significant difficulty and pain.  Baseline:  Goal status: INITIAL   PLAN:  PT FREQUENCY: 2x/week  PT DURATION: 6 weeks  PLANNED INTERVENTIONS: 97164- PT Re-evaluation, 97110-Therapeutic exercises, 97530- Therapeutic activity, [id number]- Neuromuscular re-education, 97535- Self Care, 86578- Manual therapy, [id number]- Aquatic Therapy, 97014- Electrical stimulation (unattended), [id number]- Electrical stimulation (manual), [id number]- Ultrasound, Patient/Family education, Taping, Dry Needling, Joint mobilization, Spinal mobilization, Cryotherapy, and Moist heat  PLAN FOR NEXT SESSION: review and perform HEP.  Scapular strengthening and stabiliziation.  Cont with manual therapy including STM and jt mobs to address mobility deficits and pain.    Almedia Jacobsen, PTA 09/01/23 12:29 PM [facility] [address] [place], [place], [address] Phone: [phone]   Fax:  [phone]

## 2023-09-03 ENCOUNTER — Ambulatory Visit
Admission: RE | Admit: 2023-09-03 | Discharge: 2023-09-03 | Disposition: A | Discharge status: Home / Self Care | Payer: Federal, State, Local not specified - PPO | Source: Ambulatory Visit | Attending: Obstetrics and Gynecology | Admitting: Obstetrics and Gynecology

## 2023-09-03 DIAGNOSIS — Z0000 Encounter for general adult medical examination without abnormal findings: Secondary | ICD-10-CM

## 2023-09-03 DIAGNOSIS — Z1231 Encounter for screening mammogram for malignant neoplasm of breast: Secondary | ICD-10-CM | POA: Diagnosis not present

## 2023-09-06 ENCOUNTER — Encounter: Payer: Self-pay | Admitting: Obstetrics and Gynecology

## 2023-09-08 ENCOUNTER — Ambulatory Visit (HOSPITAL_BASED_OUTPATIENT_CLINIC_OR_DEPARTMENT_OTHER): Payer: Federal, State, Local not specified - PPO | Admitting: Physical Therapy

## 2023-09-08 DIAGNOSIS — M25512 Pain in left shoulder: Secondary | ICD-10-CM

## 2023-09-08 DIAGNOSIS — M25612 Stiffness of left shoulder, not elsewhere classified: Secondary | ICD-10-CM

## 2023-09-08 DIAGNOSIS — M6281 Muscle weakness (generalized): Secondary | ICD-10-CM | POA: Diagnosis not present

## 2023-09-08 NOTE — Therapy (Signed)
OUTPATIENT PHYSICAL THERAPY SHOULDER TREATMENT   Patient Name: Elizabeth Huerta MRN: [id number] DOB:[birth year], 53 y.o., female Today's Date: 09/08/2023  END OF SESSION:  PT End of Session - 09/08/23 0908     Visit Number 4    Number of Visits 12    Date for PT Re-Evaluation 09/21/23    Authorization Type BCBS    PT Start Time 0904    PT Stop Time 0942    PT Time Calculation (min) 38 min    Activity Tolerance Patient tolerated treatment well    Behavior During Therapy [facility] for tasks assessed/performed               Past Medical History:  Diagnosis Date   Allergy    Anxiety 2003   treated   Basal cell carcinoma    nose   C. 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   Past Surgical History:  Procedure Laterality Date   BUNIONECTOMY WITH TARSAL MEDITARSAL FUSION Right 09/28/2017   CESAREAN SECTION  2001  &  2003   COLONOSCOPY  02/2017   ulcerative colitis   CYSTOSCOPY/RETROGRADE/URETEROSCOPY Right 09/28/2014   Procedure: CYSTOSCOPY RIGHT RETROGRADE RIGHT URETEROSCOPY;  Surgeon: Sebastian Ache, MD;  Location: [facility];  Service: Urology;  Laterality: Right;   EXTRACORPOREAL SHOCK WAVE LITHOTRIPSY Bilateral left 05-15-2010/   right 07-03-2010   HOLMIUM LASER APPLICATION Right 09/28/2014   Procedure: HOLMIUM LASER OF STONES ;  Surgeon: Sebastian Ache, MD;  Location: [facility];  Service: Urology;  Laterality: Right;   MOHS SURGERY  2010   nasal area   MOHS SURGERY  11/2018   nose and upper lip   Patient Active Problem List   Diagnosis Date Noted   Ulcerative colitis with complication (HCC) 10/07/2022   B12 deficiency 03/16/2022   Eczema  12/18/2019   Seborrheic dermatitis 12/18/2019   Nephrolithiasis 12/18/2019   Universal ulcerative colitis (HCC) 02/25/2017    PCP: Karie Georges, MD  REFERRING PROVIDER: Delfin Gant, MD   REFERRING DIAG: [phone] (ICD-10-CM) - Impingement syndrome of left shoulder   THERAPY DIAG:  Left shoulder pain, unspecified chronicity  Muscle weakness (generalized)  Stiffness of left shoulder, not elsewhere classified  Rationale for Evaluation and Treatment: Rehabilitation  ONSET DATE: early October 2024  SUBJECTIVE:  SUBJECTIVE STATEMENT:  Foam roller at home to short and too rigid for snow angels.  Middle doorway stretch is difficult.  Neck is much better.  "I don't have the nerve pain going down arm anymore".    From Initial Eval: Pain began in early October with no specific MOI.  Pt saw MD on 07/29/23 and had x rays.  MD ordered PT and prescribed prednisone.  Pt states MD informed her that the bone is curved more downward.  Pt received a cortisone shot. She states it took 2.5 days to feel any improvement though since has been feeling improvement daily.  Pt reports improved mobility in shoulder including reaching her head since receiving the cortisone shot.  Pt has not taken prednisone. Pt states she did have some pain in her L sided neck which traveled down L UE.  Pt states she has not been having that pain for the last 4 days.    Pt is limited with reaching overhead and laterally.  Pt has pain with hair care.  Pt avoids certain activities including lifting milk with L UE and uses R UE.  Pt has increased pain with lifting heavy objects.  Pt has increased pain with trying to close the car door.  Pt unable to sleep on L shoulder.   Hand dominance: Left  PERTINENT HISTORY: Psoriasis Ulcerative colitis R  Bunionectomy 2019    PAIN: Are you in pain? no NPRS:   Location:  Type:    PRECAUTIONS: None  RED FLAGS: None   WEIGHT BEARING RESTRICTIONS: No  FALLS:  Has patient fallen in last 6 months? 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  DIAGNOSTIC FINDINGS:  Pt had x rays though PT unable to view the results.  Pt states she has no fractures.    PATIENT SURVEYS:  FOTO 48 with a goal of 69 at visit 13  COGNITION: Overall cognitive status: Within functional limits for tasks assessed      UPPER EXTREMITY ROM:   AROM/PROM Right eval Left eval Left   Shoulder flexion 167 163 with 2/10 pain at end range   Shoulder scaption 173 150 with 2/10 pain at end range   Shoulder abduction 174 81 with 3/10 pain   Shoulder adduction     Shoulder internal rotation 76 78   Shoulder external rotation 79 66/72   Elbow flexion     Elbow extension     Wrist flexion     Wrist extension     Wrist ulnar deviation     Wrist radial deviation     Wrist pronation     Wrist supination     (Blank rows = not tested)  UPPER EXTREMITY MMT:  MMT Right eval Left eval  Shoulder flexion 5/5 4/5  Shoulder scaption 5/5 4/5  Shoulder abduction    Shoulder adduction    Shoulder internal rotation 5/5 5/5  Shoulder external rotation 5/5 Unable to tolerate, painful  Middle trapezius    Lower trapezius    Elbow flexion    Elbow extension    Wrist flexion    Wrist extension    Wrist ulnar deviation    Wrist radial deviation    Wrist  pronation    Wrist supination    Grip strength (lbs)    (Blank rows = not tested)  SHOULDER SPECIAL TESTS: Impingement tests:  Neer's:  R: negative, L:  positive  Hawkins-Kennedy:  R: negative, L: positive Crossover:  R: negative, L: slightly positive  ER lag test: negative bilat   PALPATION:  Pt has no tenderness in palpation t/o L shoulder including on the acromion                                                                                                                             TREATMENT: 09/08/23 * Hooklying: snow angels, range to tolerance x 10; shoulder flex/ext holding 2# Wate bar x 10; star gazer stretch x 5-10 sec x 6; Lt protraction x 10  * manual therapy: STM to L pec maj/ minor, latissimus; TPR to Lt infraspinatus, teres maj, subscap * Lt doorway stretch - low, middle (straight arm & bent elbow), high positions x 15s each  * pulleys - into Lt flexion and Lt abdct x 8 reps each, pause into stretch * review of form for L's and W's with scap squeeze x 5 each * L stretch at counter x 5-10s hold x 3 reps  * low row with red band x 12   PATIENT EDUCATION: Education details:  HEP, exercise form, anatomy  Person educated: Patient Education method: Explanation, Demonstration, Tactile cues, Verbal cues, Education comprehension: verbalized understanding, returned demonstration, verbal cues required, tactile cues required, and needs further education  HOME EXERCISE PROGRAM: Access Code: [email or web address] URL: [email or web address]   ASSESSMENT:  CLINICAL IMPRESSION:  Pt reporting improved neck pain.  Palpable tightness in Lt pec major/minor, lat and subscap; improved with STM to area.  Pt tolerated all exercises well, with minimal increase in pain.  Updated HEP.  Therapist to check STG next visit.  Plan to revisit open book and trial side stretch over physioball.   Eval: Patient is a 53 y.o. female with a dx of L shoulder impingement syndrome presenting to the clinic with L shoulder pain, limited ROM in L shoulder, and muscle weakness in L UE.  Pt reports improved sx's since receiving a cortisone injection.  Pt is  limited with reaching overhead and laterally.  She has pain with hair care and is limited with lifting objects.  Pt is unable to sleep on L shoulder.  Objective findings are consistent with dx.  She has positive impingement testing of L shoulder.  Pt should benefit from skilled PT services to address impairments and improve overall function.    OBJECTIVE IMPAIRMENTS: decreased activity tolerance, decreased ROM, decreased strength, hypomobility, impaired flexibility, impaired UE functional use, and pain.   ACTIVITY LIMITATIONS: carrying, lifting, reach over head, and hygiene/grooming  PARTICIPATION LIMITATIONS:   PERSONAL FACTORS: 1 comorbidity: psoriasis  are also affecting patient's functional outcome.   REHAB POTENTIAL: Good  CLINICAL DECISION MAKING: Stable/uncomplicated  EVALUATION COMPLEXITY: Low   GOALS:   SHORT TERM GOALS:  Target date: 08/31/2023   Pt will be independent and compliant with HEP for improved pain, ROM, strength, and function. Baseline: Goal status: INITIAL  2.  Pt will demo improved L shoulder AROM to at least 160 deg in scaption and 105 deg in abduction for improved UE elevation and reaching. Baseline:  Goal status: INITIAL  3.  Pt will be able to perform hair care without difficulty and without significant pain.  Baseline:  Goal status: INITIAL    LONG TERM GOALS: Target date: 09/21/2023  Pt will report she is able to perform her normal reaching activities including overhead without significant pain and difficulty.  Baseline:  Goal status: INITIAL  2.  Pt will demo improved L shoulder strength to 5/5 MMT t/o for improved performance of reaching and functional lifting/carrying.  Baseline:  Goal status: INITIAL  3.  Pt will be able to perform her ADLs and IADLs without significant difficulty and pain.  Baseline:  Goal status: INITIAL   PLAN:  PT FREQUENCY: 2x/week  PT DURATION: 6 weeks  PLANNED INTERVENTIONS: 97164- PT Re-evaluation,  97110-Therapeutic exercises, 97530- Therapeutic activity, [id number]- Neuromuscular re-education, 97535- Self Care, 16109- Manual therapy, [id number]- Aquatic Therapy, 97014- Electrical stimulation (unattended), [id number]- Electrical stimulation (manual), [id number]- Ultrasound, Patient/Family education, Taping, Dry Needling, Joint mobilization, Spinal mobilization, Cryotherapy, and Moist heat  PLAN FOR NEXT SESSION: review and perform HEP.  Scapular strengthening and stabiliziation.  Cont with manual therapy including STM and jt mobs to address mobility deficits and pain.    Mayer Camel, PTA 09/08/23 10:01 AM [facility] [address] [place], [place], [address] Phone: [phone]   Fax:  [phone]

## 2023-09-10 ENCOUNTER — Ambulatory Visit (HOSPITAL_BASED_OUTPATIENT_CLINIC_OR_DEPARTMENT_OTHER): Payer: Federal, State, Local not specified - PPO | Admitting: Physical Therapy

## 2023-09-10 ENCOUNTER — Encounter (HOSPITAL_BASED_OUTPATIENT_CLINIC_OR_DEPARTMENT_OTHER): Payer: Self-pay | Admitting: Physical Therapy

## 2023-09-10 DIAGNOSIS — M25512 Pain in left shoulder: Secondary | ICD-10-CM

## 2023-09-10 DIAGNOSIS — M25612 Stiffness of left shoulder, not elsewhere classified: Secondary | ICD-10-CM | POA: Diagnosis not present

## 2023-09-10 DIAGNOSIS — M6281 Muscle weakness (generalized): Secondary | ICD-10-CM

## 2023-09-10 NOTE — Patient Instructions (Signed)

## 2023-09-10 NOTE — Therapy (Signed)
OUTPATIENT PHYSICAL THERAPY SHOULDER TREATMENT   Patient Name: Elizabeth Huerta MRN: [id number] DOB:1970/12/04, 53 y.o., female Today's Date: 09/10/2023  END OF SESSION:  PT End of Session - 09/10/23 1034     Visit Number 5    Number of Visits 12    Date for PT Re-Evaluation 09/21/23    Authorization Type BCBS    PT Start Time 1028    PT Stop Time 1110    PT Time Calculation (min) 42 min    Behavior During Therapy [facility] for tasks assessed/performed               Past Medical History:  Diagnosis Date   Allergy    Anxiety 2003   treated   Basal cell carcinoma    nose   C. 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   Past Surgical History:  Procedure Laterality Date   BUNIONECTOMY WITH TARSAL MEDITARSAL FUSION Right 09/28/2017   CESAREAN SECTION  2001  &  2003   COLONOSCOPY  02/2017   ulcerative colitis   CYSTOSCOPY/RETROGRADE/URETEROSCOPY Right 09/28/2014   Procedure: CYSTOSCOPY RIGHT RETROGRADE RIGHT URETEROSCOPY;  Surgeon: Sebastian Ache, MD;  Location: [facility];  Service: Urology;  Laterality: Right;   EXTRACORPOREAL SHOCK WAVE LITHOTRIPSY Bilateral left 05-15-2010/   right 07-03-2010   HOLMIUM LASER APPLICATION Right 09/28/2014   Procedure: HOLMIUM LASER OF STONES ;  Surgeon: Sebastian Ache, MD;  Location: [facility];  Service: Urology;  Laterality: Right;   MOHS SURGERY  2010   nasal area   MOHS SURGERY  11/2018   nose and upper lip   Patient Active Problem List   Diagnosis Date Noted   Ulcerative colitis with complication (HCC) 10/07/2022   B12 deficiency 03/16/2022   Eczema 12/18/2019   Seborrheic dermatitis 12/18/2019    Nephrolithiasis 12/18/2019   Universal ulcerative colitis (HCC) 02/25/2017    PCP: Karie Georges, MD  REFERRING PROVIDER: Delfin Gant, MD   REFERRING DIAG: [phone] (ICD-10-CM) - Impingement syndrome of left shoulder   THERAPY DIAG:  Left shoulder pain, unspecified chronicity  Muscle weakness (generalized)  Stiffness of left shoulder, not elsewhere classified  Rationale for Evaluation and Treatment: Rehabilitation  ONSET DATE: early October 2024  SUBJECTIVE:  SUBJECTIVE STATEMENT:  "I'm sore today".  Pt reports that the star gazer stretch continues to be difficult.  Hair care is getting better, but not fully back to normal.    From Initial Eval: Pain began in early October with no specific MOI.  Pt saw MD on 07/29/23 and had x rays.  MD ordered PT and prescribed prednisone.  Pt states MD informed her that the bone is curved more downward.  Pt received a cortisone shot. She states it took 2.5 days to feel any improvement though since has been feeling improvement daily.  Pt reports improved mobility in shoulder including reaching her head since receiving the cortisone shot.  Pt has not taken prednisone. Pt states she did have some pain in her L sided neck which traveled down L UE.  Pt states she has not been having that pain for the last 4 days.    Pt is limited with reaching overhead and laterally.  Pt has pain with hair care.  Pt avoids certain activities including lifting milk with L UE and uses R UE.  Pt has increased pain with lifting heavy objects.  Pt has increased pain with trying to close the car door.  Pt unable to sleep on L shoulder.   Hand dominance: Left  PERTINENT HISTORY: Psoriasis Ulcerative colitis R Bunionectomy 2019    PAIN: Are you in pain? no NPRS:   Location:  Type:     PRECAUTIONS: None  RED FLAGS: None   WEIGHT BEARING RESTRICTIONS: No  FALLS:  Has patient fallen in last 6 months? 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  DIAGNOSTIC FINDINGS:  Pt had x rays though PT unable to view the results.  Pt states she has no fractures.    PATIENT SURVEYS:  FOTO 48 with a goal of 69 at visit 13  COGNITION: Overall cognitive status: Within functional limits for tasks assessed      UPPER EXTREMITY ROM:   AROM/PROM Right eval Left eval Left  09/10/23  Shoulder flexion 167 163 with 2/10 pain at end range   Shoulder scaption 173 150 with 2/10 pain at end range 150 without pain at end range  Shoulder abduction 174 81 with 3/10 pain 130 Without pain  Shoulder adduction     Shoulder internal rotation 76 78   Shoulder external rotation 79 66/72   Elbow flexion     Elbow extension     Wrist flexion     Wrist extension     Wrist ulnar deviation     Wrist radial deviation     Wrist pronation     Wrist supination     (Blank rows = not tested)  UPPER EXTREMITY MMT:  MMT Right eval Left eval  Shoulder flexion 5/5 4/5  Shoulder scaption 5/5 4/5  Shoulder abduction    Shoulder adduction    Shoulder internal rotation 5/5 5/5  Shoulder external rotation 5/5 Unable to tolerate, painful  Middle trapezius    Lower trapezius    Elbow flexion    Elbow extension    Wrist flexion    Wrist extension    Wrist ulnar deviation    Wrist radial deviation  Wrist pronation    Wrist supination    Grip strength (lbs)    (Blank rows = not tested)  SHOULDER SPECIAL TESTS: Impingement tests:  Neer's:  R: negative, L: positive  Hawkins-Kennedy:   R: negative, L: positive Crossover:  R: negative, L: slightly positive  ER lag test: negative bilat   PALPATION:  Pt has no tenderness in palpation t/o L shoulder including on the acromion                                                                                                                             TODAY's TREATMENT: 09/10/23: Therapeutic ex:  UBE L2, 1 min forward/ 1 min backward/ 30 sec forw/backward 3 way doorway stretch with LUE, 20s holds x 2 each - minor cues for form  Manual Therapy: Trigger Point Dry Needling  Initial Treatment: Pt instructed on Dry Needling rational, procedures, and possible side effects. Pt instructed to expect mild to moderate muscle soreness later in the day and/or into the next day.  Pt instructed in methods to reduce muscle soreness. Pt instructed to continue prescribed HEP. Because Dry Needling was performed over or adjacent to a lung field, pt was educated on S/S of pneumothorax and to seek immediate medical attention should they occur.  Patient was educated on signs and symptoms of infection and other risk factors and advised to seek medical attention should they occur.  Patient verbalized understanding of these instructions and education.   Patient Verbal Consent Given: Yes Education Handout Provided: Yes Muscles Treated: pec major Electrical Stimulation Performed: No Treatment Response/Outcome: significant twitch response and palpable increase in soft tissue extensibility  STM to L pec maj/ minor, latissimus; TPR to Lt infraspinatus, teres maj, subscap  Therapeutic ex: shoulder flex/ext holding 2# Wate bar x 10 Open book L x 5 Side stretch over physioball x 5   09/08/23 * Hooklying: snow angels, range to tolerance x 10; shoulder flex/ext holding 2# Wate bar x 10; star gazer stretch x 5-10 sec x 6; Lt protraction x 10  * manual therapy: STM to L pec maj/ minor, latissimus; TPR to Lt infraspinatus, teres maj, subscap * Lt  doorway stretch - low, middle (straight arm & bent elbow), high positions x 15s each  * pulleys - into Lt flexion and Lt abdct x 8 reps each, pause into stretch * review of form for L's and W's with scap squeeze x 5 each * L stretch at counter x 5-10s hold x 3 reps  * low row with red band x 12   PATIENT EDUCATION: Education details:  exercise form, anatomy; TPDN Person educated: Patient Education method: Explanation, Demonstration, Tactile cues, Verbal cues, Education comprehension: verbalized understanding, returned demonstration, verbal cues required, tactile cues required, and needs further education  HOME EXERCISE PROGRAM: Access Code: [email or web address] URL: [email or web address]  Access Code: [email or web address] URL: [email or web address] Date: 09/10/2023 Patient Education - Trigger Point Dry Needling  ASSESSMENT:  CLINICAL IMPRESSION:  Pt reported  reoccurrence of L neck pain; improved after completing warm up. Trial of TPDN performed by supervising PT, Zebedee Iba (see above). Pt tolerated all exercises well, with minimal/no increase in pain. Pt is making progress towards STG with improved ability to use LUE for hair care. She has partially met STG2.  Will continue to progress as tolerated.     Eval: Patient is a 53 y.o. female with a dx of L shoulder impingement syndrome presenting to the clinic with L shoulder pain, limited ROM in L shoulder, and muscle weakness in L UE.  Pt reports improved sx's since receiving a cortisone injection.  Pt is limited with reaching overhead and laterally.  She has pain with hair care and is limited with lifting objects.  Pt is unable to sleep on L shoulder.  Objective findings are consistent with dx.  She has positive impingement testing of L shoulder.  Pt should benefit from skilled PT services to address impairments and improve overall function.    OBJECTIVE IMPAIRMENTS: decreased activity tolerance, decreased ROM, decreased strength,  hypomobility, impaired flexibility, impaired UE functional use, and pain.   ACTIVITY LIMITATIONS: carrying, lifting, reach over head, and hygiene/grooming  PARTICIPATION LIMITATIONS:   PERSONAL FACTORS: 1 comorbidity: psoriasis  are also affecting patient's functional outcome.   REHAB POTENTIAL: Good  CLINICAL DECISION MAKING: Stable/uncomplicated  EVALUATION COMPLEXITY: Low   GOALS:   SHORT TERM GOALS: Target date: 08/31/2023   Pt will be independent and compliant with HEP for improved pain, ROM, strength, and function. Baseline: Goal status: IN PROGRESS - 09/10/23  2.  Pt will demo improved L shoulder AROM to at least 160 deg in scaption and 105 deg in abduction for improved UE elevation and reaching. Baseline:  see chart above  Goal status: Partially met  -09/10/23  3.  Pt will be able to perform hair care without difficulty and without significant pain.  Baseline:  Goal status: IN PROGRESS -09/10/23    LONG TERM GOALS: Target date: 09/21/2023  Pt will report she is able to perform her normal reaching activities including overhead without significant pain and difficulty.  Baseline:  Goal status: INITIAL  2.  Pt will demo improved L shoulder strength to 5/5 MMT t/o for improved performance of reaching and functional lifting/carrying.  Baseline:  Goal status: INITIAL  3.  Pt will be able to perform her ADLs and IADLs without significant difficulty and pain.  Baseline:  Goal status: INITIAL   PLAN:  PT FREQUENCY: 2x/week  PT DURATION: 6 weeks  PLANNED INTERVENTIONS: 97164- PT Re-evaluation, 97110-Therapeutic exercises, 97530- Therapeutic activity, [id number]- Neuromuscular re-education, 97535- Self Care, 56213- Manual therapy, [id number]- Aquatic Therapy, 97014- Electrical stimulation (unattended), [id number]- Electrical stimulation (manual), 97035- Ultrasound, Patient/Family education, Taping, Dry Needling, Joint mobilization, Spinal mobilization, Cryotherapy, and Moist  heat  PLAN FOR NEXT SESSION: assess response to DN.  Scapular strengthening and stabiliziation.  Cont with manual therapy including STM and jt mobs to address mobility deficits and pain.    Mayer Camel, PTA 09/10/23 12:21 PM [facility] [address] [place], [place], [address] Phone: [phone]   Fax:  [phone]   Zebedee Iba PT, DPT 09/14/23 9:25 AM

## 2023-09-14 ENCOUNTER — Encounter (HOSPITAL_BASED_OUTPATIENT_CLINIC_OR_DEPARTMENT_OTHER): Payer: Self-pay | Admitting: Physical Therapy

## 2023-09-14 ENCOUNTER — Ambulatory Visit (HOSPITAL_BASED_OUTPATIENT_CLINIC_OR_DEPARTMENT_OTHER): Payer: Federal, State, Local not specified - PPO | Admitting: Physical Therapy

## 2023-09-14 DIAGNOSIS — M25612 Stiffness of left shoulder, not elsewhere classified: Secondary | ICD-10-CM

## 2023-09-14 DIAGNOSIS — M6281 Muscle weakness (generalized): Secondary | ICD-10-CM

## 2023-09-14 DIAGNOSIS — M25512 Pain in left shoulder: Secondary | ICD-10-CM | POA: Diagnosis not present

## 2023-09-14 NOTE — Therapy (Signed)
OUTPATIENT PHYSICAL THERAPY SHOULDER TREATMENT   Patient Name: Elizabeth Huerta MRN: [id number] DOB:[birth year], 53 y.o., female Today's Date: 09/14/2023  END OF SESSION:  PT End of Session - 09/14/23 0858     Visit Number 6    Number of Visits 12    Date for PT Re-Evaluation 09/21/23    Authorization Type BCBS    PT Start Time 0856    PT Stop Time 0935    PT Time Calculation (min) 39 min    Activity Tolerance Patient tolerated treatment well    Behavior During Therapy [facility] for tasks assessed/performed               Past Medical History:  Diagnosis Date   Allergy    Anxiety 2003   treated   Basal cell carcinoma    nose   C. 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   Past Surgical History:  Procedure Laterality Date   BUNIONECTOMY WITH TARSAL MEDITARSAL FUSION Right 09/28/2017   CESAREAN SECTION  2001  &  2003   COLONOSCOPY  02/2017   ulcerative colitis   CYSTOSCOPY/RETROGRADE/URETEROSCOPY Right 09/28/2014   Procedure: CYSTOSCOPY RIGHT RETROGRADE RIGHT URETEROSCOPY;  Surgeon: Sebastian Ache, MD;  Location: [facility];  Service: Urology;  Laterality: Right;   EXTRACORPOREAL SHOCK WAVE LITHOTRIPSY Bilateral left 05-15-2010/   right 07-03-2010   HOLMIUM LASER APPLICATION Right 09/28/2014   Procedure: HOLMIUM LASER OF STONES ;  Surgeon: Sebastian Ache, MD;  Location: [facility];  Service: Urology;  Laterality: Right;   MOHS SURGERY  2010   nasal area   MOHS SURGERY  11/2018   nose and upper lip   Patient Active Problem List   Diagnosis Date Noted   Ulcerative colitis with complication (HCC) 10/07/2022   B12 deficiency 03/16/2022   Eczema  12/18/2019   Seborrheic dermatitis 12/18/2019   Nephrolithiasis 12/18/2019   Universal ulcerative colitis (HCC) 02/25/2017    PCP: Karie Georges, MD  REFERRING PROVIDER: Delfin Gant, MD   REFERRING DIAG: [phone] (ICD-10-CM) - Impingement syndrome of left shoulder   THERAPY DIAG:  Left shoulder pain, unspecified chronicity  Muscle weakness (generalized)  Stiffness of left shoulder, not elsewhere classified  Rationale for Evaluation and Treatment: Rehabilitation  ONSET DATE: early October 2024  SUBJECTIVE:  SUBJECTIVE STATEMENT:  "I'm sleeping better, but I tried to sleep on it (Lt shoulder) and that's a no go".  "Sleeping on my back is not bothering me as much."   From Initial Eval: Pain began in early October with no specific MOI.  Pt saw MD on 07/29/23 and had x rays.  MD ordered PT and prescribed prednisone.  Pt states MD informed her that the bone is curved more downward.  Pt received a cortisone shot. She states it took 2.5 days to feel any improvement though since has been feeling improvement daily.  Pt reports improved mobility in shoulder including reaching her head since receiving the cortisone shot.  Pt has not taken prednisone. Pt states she did have some pain in her L sided neck which traveled down L UE.  Pt states she has not been having that pain for the last 4 days.    Pt is limited with reaching overhead and laterally.  Pt has pain with hair care.  Pt avoids certain activities including lifting milk with L UE and uses R UE.  Pt has increased pain with lifting heavy objects.  Pt has increased pain with trying to close the car door.  Pt unable to sleep on L shoulder.   Hand dominance: Left  PERTINENT HISTORY: Psoriasis Ulcerative colitis R Bunionectomy 2019    PAIN: Are you in  pain? no NPRS:   Location:  Type:    PRECAUTIONS: None  RED FLAGS: None   WEIGHT BEARING RESTRICTIONS: No  FALLS:  Has patient fallen in last 6 months? 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  DIAGNOSTIC FINDINGS:  Pt had x rays though PT unable to view the results.  Pt states she has no fractures.    PATIENT SURVEYS:  FOTO 48 with a goal of 69 at visit 13  COGNITION: Overall cognitive status: Within functional limits for tasks assessed      UPPER EXTREMITY ROM:   AROM/PROM Right eval Left eval Left  09/10/23  Shoulder flexion 167 163 with 2/10 pain at end range   Shoulder scaption 173 150 with 2/10 pain at end range 150 without pain at end range  Shoulder abduction 174 81 with 3/10 pain 130 Without pain  Shoulder adduction     Shoulder internal rotation 76 78   Shoulder external rotation 79 66/72   Elbow flexion     Elbow extension     Wrist flexion     Wrist extension     Wrist ulnar deviation     Wrist radial deviation     Wrist pronation     Wrist supination     (Blank rows = not tested)  UPPER EXTREMITY MMT:  MMT Right eval Left eval  Shoulder flexion 5/5 4/5  Shoulder scaption 5/5 4/5  Shoulder abduction    Shoulder adduction    Shoulder internal rotation 5/5 5/5  Shoulder external rotation 5/5 Unable to tolerate, painful  Middle trapezius    Lower trapezius    Elbow flexion    Elbow extension    Wrist flexion    Wrist extension    Wrist ulnar deviation    Wrist radial deviation  Wrist pronation    Wrist supination    Grip strength (lbs)    (Blank rows = not tested)  SHOULDER SPECIAL TESTS: Impingement tests:  Neer's:  R:  negative, L: positive  Hawkins-Kennedy:  R: negative, L: positive Crossover:  R: negative, L: slightly positive  ER lag test: negative bilat   PALPATION:  Pt has no tenderness in palpation t/o L shoulder including on the acromion                                                                                                                             TODAY's TREATMENT: 09/14/23: Therapeutic ex:  UBE L2, 1 min forward/ 1 min backward x 2 3 way doorway stretch with LUE, 20s holds, minor cues for form L stretch at counter x 20 sec x 2 L sleeper stretch into IR, 30 sec  x 3 Supine bilat shoulder ext/flex with 3# wate bar x 10, pausing with UE overhead Supine L shoulder abdct (on diagonal) AAROM with cane x 10 Hooklying on 1/2 foam roller:  snow angels to tolerance x 10;  bilat shoulder horiz abdct with red band x 10;  sash D2 flex with red band x 10; bilat ER with red band x 10  Star gazer x 30 sec Open book - varied technique x 10 Standing bilat shoulder ext with 3# wate bar Manual therapy: STM to L pec maj/ minor, latissimus; TPR to Lt infraspinatus, teres maj, subscap  09/10/23: Therapeutic ex:  UBE L2, 1 min forward/ 1 min backward/ 30 sec forw/backward 3 way doorway stretch with LUE, 20s holds x 2 each - minor cues for form  Manual Therapy: Trigger Point Dry Needling  Initial Treatment: Pt instructed on Dry Needling rational, procedures, and possible side effects. Pt instructed to expect mild to moderate muscle soreness later in the day and/or into the next day.  Pt instructed in methods to reduce muscle soreness. Pt instructed to continue prescribed HEP. Because Dry Needling was performed over or adjacent to a lung field, pt was educated on S/S of pneumothorax and to seek immediate medical attention should they occur.  Patient was educated on signs and symptoms of infection and other risk factors and advised to seek medical attention should they occur.  Patient verbalized  understanding of these instructions and education.   Patient Verbal Consent Given: Yes Education Handout Provided: Yes Muscles Treated: pec major Electrical Stimulation Performed: No Treatment Response/Outcome: significant twitch response and palpable increase in soft tissue extensibility  STM to L pec maj/ minor, latissimus; TPR to Lt infraspinatus, teres maj, subscap  Therapeutic ex: shoulder flex/ext holding 2# Wate bar x 10 Open book L x 5 Side stretch over physioball x 5   09/08/23 * Hooklying: snow angels, range to tolerance x 10; shoulder flex/ext holding 2# Wate bar x 10; star gazer stretch x 5-10 sec x 6; Lt protraction x 10  * manual therapy: STM to L pec maj/ minor,  latissimus; TPR to Lt infraspinatus, teres maj, subscap * Lt doorway stretch - low, middle (straight arm & bent elbow), high positions x 15s each  * pulleys - into Lt flexion and Lt abdct x 8 reps each, pause into stretch * review of form for L's and W's with scap squeeze x 5 each * L stretch at counter x 5-10s hold x 3 reps  * low row with red band x 12   PATIENT EDUCATION: Education details:  exercise form, anatomy; TPDN Person educated: Patient Education method: Explanation, Demonstration, Tactile cues, Verbal cues, Education comprehension: verbalized understanding, returned demonstration, verbal cues required, tactile cues required, and needs further education  HOME EXERCISE PROGRAM: Access Code: [email or web address] URL: [email or web address]  Access Code: [email or web address] URL: [email or web address] Date: 09/10/2023 Patient Education - Trigger Point Dry Needling  ASSESSMENT:  CLINICAL IMPRESSION:  Pt tolerated progression of exercises well, with minimal/no increase in pain. Continues to have limitations with end range flexion and abduction/scaption.  Progressing well towards goals.   Will review and progress HEP as tolerated.     Eval: Patient is a 53 y.o. female with a dx of L  shoulder impingement syndrome presenting to the clinic with L shoulder pain, limited ROM in L shoulder, and muscle weakness in L UE.  Pt reports improved sx's since receiving a cortisone injection.  Pt is limited with reaching overhead and laterally.  She has pain with hair care and is limited with lifting objects.  Pt is unable to sleep on L shoulder.  Objective findings are consistent with dx.  She has positive impingement testing of L shoulder.  Pt should benefit from skilled PT services to address impairments and improve overall function.    OBJECTIVE IMPAIRMENTS: decreased activity tolerance, decreased ROM, decreased strength, hypomobility, impaired flexibility, impaired UE functional use, and pain.   ACTIVITY LIMITATIONS: carrying, lifting, reach over head, and hygiene/grooming  PARTICIPATION LIMITATIONS:   PERSONAL FACTORS: 1 comorbidity: psoriasis  are also affecting patient's functional outcome.   REHAB POTENTIAL: Good  CLINICAL DECISION MAKING: Stable/uncomplicated  EVALUATION COMPLEXITY: Low   GOALS:   SHORT TERM GOALS: Target date: 08/31/2023   Pt will be independent and compliant with HEP for improved pain, ROM, strength, and function. Baseline: Goal status: IN PROGRESS - 09/10/23  2.  Pt will demo improved L shoulder AROM to at least 160 deg in scaption and 105 deg in abduction for improved UE elevation and reaching. Baseline:  see chart above  Goal status: Partially met  -09/10/23  3.  Pt will be able to perform hair care without difficulty and without significant pain.  Baseline:  Goal status: IN PROGRESS -09/10/23    LONG TERM GOALS: Target date: 09/21/2023  Pt will report she is able to perform her normal reaching activities including overhead without significant pain and difficulty.  Baseline:  Goal status: INITIAL  2.  Pt will demo improved L shoulder strength to 5/5 MMT t/o for improved performance of reaching and functional lifting/carrying.  Baseline:   Goal status: INITIAL  3.  Pt will be able to perform her ADLs and IADLs without significant difficulty and pain.  Baseline:  Goal status: INITIAL   PLAN:  PT FREQUENCY: 2x/week  PT DURATION: 6 weeks  PLANNED INTERVENTIONS: 97164- PT Re-evaluation, 97110-Therapeutic exercises, 97530- Therapeutic activity, [id number]- Neuromuscular re-education, 97535- Self Care, 01027- Manual therapy, [id number]- Aquatic Therapy, 97014- Electrical stimulation (unattended), [id number]- Electrical stimulation (manual), [id number]- Ultrasound, Patient/Family education, Taping, Dry Needling, Joint mobilization, Spinal mobilization, Cryotherapy, and Moist heat  PLAN FOR NEXT SESSION: assess response to DN.  Scapular strengthening and stabiliziation.  Cont with manual therapy including STM and jt mobs to address mobility deficits and pain.    Mayer Camel, PTA 09/14/23 9:42 AM [facility] [address] [place], [place], [address] Phone: [phone]   Fax:  [phone]

## 2023-09-16 ENCOUNTER — Encounter (HOSPITAL_BASED_OUTPATIENT_CLINIC_OR_DEPARTMENT_OTHER): Payer: Self-pay | Admitting: Physical Therapy

## 2023-09-16 ENCOUNTER — Ambulatory Visit (HOSPITAL_BASED_OUTPATIENT_CLINIC_OR_DEPARTMENT_OTHER): Payer: Federal, State, Local not specified - PPO | Admitting: Physical Therapy

## 2023-09-16 DIAGNOSIS — M25512 Pain in left shoulder: Secondary | ICD-10-CM | POA: Diagnosis not present

## 2023-09-16 DIAGNOSIS — M6281 Muscle weakness (generalized): Secondary | ICD-10-CM

## 2023-09-16 DIAGNOSIS — M25612 Stiffness of left shoulder, not elsewhere classified: Secondary | ICD-10-CM | POA: Diagnosis not present

## 2023-09-16 NOTE — Therapy (Signed)
OUTPATIENT PHYSICAL THERAPY SHOULDER TREATMENT   Patient Name: Elizabeth Huerta MRN: [id number] DOB:Aug 21, 1970, 53 y.o., female Today's Date: 09/16/2023  END OF SESSION:  PT End of Session - 09/16/23 0903     Visit Number 7    Number of Visits 12    Date for PT Re-Evaluation 09/21/23    Authorization Type [employer]    PT Start Time 0900    PT Stop Time 0940    PT Time Calculation (min) 40 min    Activity Tolerance Patient tolerated treatment well    Behavior During Therapy [facility] for tasks assessed/performed               Past Medical History:  Diagnosis Date   Allergy    Anxiety 2003   treated   Basal cell carcinoma    nose   C. 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   Past Surgical History:  Procedure Laterality Date   BUNIONECTOMY WITH TARSAL MEDITARSAL FUSION Right 09/28/2017   CESAREAN SECTION  2001  &  2003   COLONOSCOPY  02/2017   ulcerative colitis   CYSTOSCOPY/RETROGRADE/URETEROSCOPY Right 09/28/2014   Procedure: CYSTOSCOPY RIGHT RETROGRADE RIGHT URETEROSCOPY;  Surgeon: Sebastian Ache, MD;  Location: [facility];  Service: Urology;  Laterality: Right;   EXTRACORPOREAL SHOCK WAVE LITHOTRIPSY Bilateral left 05-15-2010/   right 07-03-2010   HOLMIUM LASER APPLICATION Right 09/28/2014   Procedure: HOLMIUM LASER OF STONES ;  Surgeon: Sebastian Ache, MD;  Location: [facility];  Service: Urology;  Laterality: Right;   MOHS SURGERY  2010   nasal area   MOHS SURGERY  11/2018   nose and upper lip   Patient Active Problem List   Diagnosis Date Noted   Ulcerative colitis with complication (HCC) 10/07/2022   B12 deficiency 03/16/2022   Eczema  12/18/2019   Seborrheic dermatitis 12/18/2019   Nephrolithiasis 12/18/2019   Universal ulcerative colitis (HCC) 02/25/2017    PCP: Karie Georges, MD  REFERRING PROVIDER: Delfin Gant, MD   REFERRING DIAG: [id number] (ICD-10-CM) - Impingement syndrome of left shoulder   THERAPY DIAG:  Left shoulder pain, unspecified chronicity  Muscle weakness (generalized)  Stiffness of left shoulder, not elsewhere classified  Rationale for Evaluation and Treatment: Rehabilitation  ONSET DATE: early October 2024  SUBJECTIVE:  SUBJECTIVE STATEMENT: Pt reports she was unable to lift a bag of cookbooks from floor with LUE.   She reports she was sore yesterday, but no pain this morning.    From Initial Eval: Pain began in early October with no specific MOI.  Pt saw MD on 07/29/23 and had x rays.  MD ordered PT and prescribed prednisone.  Pt states MD informed her that the bone is curved more downward.  Pt received a cortisone shot. She states it took 2.5 days to feel any improvement though since has been feeling improvement daily.  Pt reports improved mobility in shoulder including reaching her head since receiving the cortisone shot.  Pt has not taken prednisone. Pt states she did have some pain in her L sided neck which traveled down L UE.  Pt states she has not been having that pain for the last 4 days.    Pt is limited with reaching overhead and laterally.  Pt has pain with hair care.  Pt avoids certain activities including lifting milk with L UE and uses R UE.  Pt has increased pain with lifting heavy objects.  Pt has increased pain with trying to close the car door.  Pt unable to sleep on L shoulder.   Hand dominance: Left  PERTINENT HISTORY: Psoriasis Ulcerative colitis R Bunionectomy 2019    PAIN: Are you  in pain? no NPRS:   Location:  Type:    PRECAUTIONS: None  RED FLAGS: None   WEIGHT BEARING RESTRICTIONS: No  FALLS:  Has patient fallen in last 6 months? 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  DIAGNOSTIC FINDINGS:  Pt had x rays though PT unable to view the results.  Pt states she has no fractures.    PATIENT SURVEYS:  FOTO 48 with a goal of 69 at visit 13  COGNITION: Overall cognitive status: Within functional limits for tasks assessed      UPPER EXTREMITY ROM:   AROM/PROM Right eval Left eval Left  09/10/23  Shoulder flexion 167 163 with 2/10 pain at end range   Shoulder scaption 173 150 with 2/10 pain at end range 150 without pain at end range  Shoulder abduction 174 81 with 3/10 pain 130 Without pain  Shoulder adduction     Shoulder internal rotation 76 78   Shoulder external rotation 79 66/72   Elbow flexion     Elbow extension     Wrist flexion     Wrist extension     Wrist ulnar deviation     Wrist radial deviation     Wrist pronation     Wrist supination     (Blank rows = not tested)  UPPER EXTREMITY MMT:  MMT Right eval Left eval Right 09/16/23 Left  09/16/23  Shoulder flexion 5/5 4/5 26.8 18.8 *  Shoulder scaption 5/5 4/5    Shoulder abduction      Shoulder adduction      Shoulder internal rotation 5/5 5/5    Shoulder external rotation 5/5 Unable to tolerate, painful 19.2 11.0 *  Middle trapezius      Lower trapezius      Elbow flexion      Elbow extension  Wrist flexion      Wrist extension      Wrist ulnar deviation      Wrist radial deviation      Wrist pronation      Wrist supination      Grip strength (lbs)       (Blank rows = not tested)   *- with pain  SHOULDER SPECIAL TESTS: Impingement tests:  Neer's:  R: negative, L: positive  Hawkins-Kennedy:  R: negative, L: positive Crossover:  R: negative, L: slightly positive  ER lag test: negative bilat   PALPATION:  Pt has no tenderness in palpation t/o L shoulder including on the acromion                                                                                                                             TODAY's TREATMENT: 09/16/23: Therapeutic ex: NuStep L4, UE only (UE set at 10) x 4 min for warm up/ ROM Standing bilat shoulder ext with 2# wate bar x 15 Standing LUE D2 flex pattern AAROM with wate bar in front of mirror x 10 Wall push up with elbows in x 5, elbows out x 5 Bent over row with 4# x 12, with cues for scap retraction prior to lifting wt Supine resisted ER with yellow band x 10, range to tolerance Supine resisted horiz abdct/ addct x 25 Supine bilat overhead reach with yellow band between hands x 10 Supine snow angels, range to tolerance x 8; L open book x 6 Manual therapy: STM to L pec maj/ minor, latissimus; TPR to Lt infraspinatus, teres maj  09/14/23: Therapeutic ex:  UBE L2, 1 min forward/ 1 min backward x 2 3 way doorway stretch with LUE, 20s holds, minor cues for form L stretch at counter x 20 sec x 2 L sleeper stretch into IR, 30 sec  x 3 Supine bilat shoulder ext/flex with 3# wate bar x 10, pausing with UE overhead Supine L shoulder abdct (on diagonal) AAROM with cane x 10 Hooklying on 1/2 foam roller:  snow angels to tolerance x 10;  bilat shoulder horiz abdct with red band x 10;  sash D2 flex with red band x 10; bilat ER with red band x 10  Star gazer x 30 sec Open book - varied technique x 10 Standing bilat shoulder ext with 3# wate bar Manual therapy: STM to L pec maj/ minor, latissimus; TPR to Lt infraspinatus, teres maj, subscap  09/10/23: Therapeutic ex:  UBE L2, 1 min forward/ 1 min backward/ 30 sec  forw/backward 3 way doorway stretch with LUE, 20s holds x 2 each - minor cues for form  Manual Therapy: Trigger Point Dry Needling  Initial Treatment: Pt instructed on Dry Needling rational, procedures, and possible side effects. Pt instructed to expect mild to moderate muscle soreness later in the day and/or into the next day.  Pt instructed in methods to reduce muscle soreness. Pt instructed to continue prescribed HEP.  Because Dry Needling was performed over or adjacent to a lung field, pt was educated on S/S of pneumothorax and to seek immediate medical attention should they occur.  Patient was educated on signs and symptoms of infection and other risk factors and advised to seek medical attention should they occur.  Patient verbalized understanding of these instructions and education.   Patient Verbal Consent Given: Yes Education Handout Provided: Yes Muscles Treated: pec major Electrical Stimulation Performed: No Treatment Response/Outcome: significant twitch response and palpable increase in soft tissue extensibility  STM to L pec maj/ minor, latissimus; TPR to Lt infraspinatus, teres maj, subscap  Therapeutic ex: shoulder flex/ext holding 2# Wate bar x 10 Open book L x 5 Side stretch over physioball x 5   09/08/23 * Hooklying: snow angels, range to tolerance x 10; shoulder flex/ext holding 2# Wate bar x 10; star gazer stretch x 5-10 sec x 6; Lt protraction x 10  * manual therapy: STM to L pec maj/ minor, latissimus; TPR to Lt infraspinatus, teres maj, subscap * Lt doorway stretch - low, middle (straight arm & bent elbow), high positions x 15s each  * pulleys - into Lt flexion and Lt abdct x 8 reps each, pause into stretch * review of form for L's and W's with scap squeeze x 5 each * L stretch at counter x 5-10s hold x 3 reps  * low row with red band x 12   PATIENT EDUCATION: Education details:  exercise form, anatomy; HEP Person educated: Patient Education method:  Explanation, Demonstration, Tactile cues, Verbal cues, Education comprehension: verbalized understanding, returned demonstration, verbal cues required, tactile cues required, and needs further education  HOME EXERCISE PROGRAM: Exercise Access Code: [email or web address] URL: [email or web address]  Education Access Code: [email or web address] - Trigger Point Dry Needling  ASSESSMENT:  CLINICAL IMPRESSION: Pt tolerated progression of exercises well, with minimal increase in pain. Strength tested today (see chart above); able to tolerate ER testing this date.  Repeated gentle strengthening exercises with yellow band; issued band and added to HEP.   Continues to have ROM and strength limitations with LUE flexion, abduction/scaption, and ER; but improving each visit.   Progressing well towards goals.       Eval: Patient is a 53 y.o. female with a dx of L shoulder impingement syndrome presenting to the clinic with L shoulder pain, limited ROM in L shoulder, and muscle weakness in L UE.  Pt reports improved sx's since receiving a cortisone injection.  Pt is limited with reaching overhead and laterally.  She has pain with hair care and is limited with lifting objects.  Pt is unable to sleep on L shoulder.  Objective findings are consistent with dx.  She has positive impingement testing of L shoulder.  Pt should benefit from skilled PT services to address impairments and improve overall function.    OBJECTIVE IMPAIRMENTS: decreased activity tolerance, decreased ROM, decreased strength, hypomobility, impaired flexibility, impaired UE functional use, and pain.   ACTIVITY LIMITATIONS: carrying, lifting, reach over head, and hygiene/grooming  PARTICIPATION LIMITATIONS:   PERSONAL FACTORS: 1 comorbidity: psoriasis  are also affecting patient's functional outcome.   REHAB POTENTIAL: Good  CLINICAL DECISION MAKING: Stable/uncomplicated  EVALUATION COMPLEXITY: Low   GOALS:   SHORT TERM GOALS: Target date:  08/31/2023   Pt will be independent and compliant with HEP for improved pain, ROM, strength, and function. Baseline: Goal status: IN PROGRESS - 09/10/23  2.  Pt will demo improved L shoulder AROM to at least 160 deg in  scaption and 105 deg in abduction for improved UE elevation and reaching. Baseline:  see chart above  Goal status: Partially met  -09/10/23  3.  Pt will be able to perform hair care without difficulty and without significant pain.  Baseline:  Goal status: IN PROGRESS -09/10/23    LONG TERM GOALS: Target date: 09/21/2023  Pt will report she is able to perform her normal reaching activities including overhead without significant pain and difficulty.  Baseline:  Goal status: INITIAL  2.  Pt will demo improved L shoulder strength to 5/5 MMT t/o for improved performance of reaching and functional lifting/carrying.  Baseline:  Goal status: INITIAL  3.  Pt will be able to perform her ADLs and IADLs without significant difficulty and pain.  Baseline:  Goal status: INITIAL   PLAN:  PT FREQUENCY: 2x/week  PT DURATION: 6 weeks  PLANNED INTERVENTIONS: 97164- PT Re-evaluation, 97110-Therapeutic exercises, 97530- Therapeutic activity, [id number]- Neuromuscular re-education, 97535- Self Care, 72536- Manual therapy, [id number]- Aquatic Therapy, 97014- Electrical stimulation (unattended), [id number]- Electrical stimulation (manual), 97035- Ultrasound, Patient/Family education, Taping, Dry Needling, Joint mobilization, Spinal mobilization, Cryotherapy, and Moist heat  PLAN FOR NEXT SESSION: assess response to DN.  Scapular strengthening and stabiliziation.  Cont with manual therapy including STM and jt mobs to address mobility deficits and pain.     Mayer Camel, PTA 09/16/23 1:57 PM [facility] [address] [place], [place], [address] Phone: [phone]   Fax:  [phone]

## 2023-09-21 ENCOUNTER — Encounter (HOSPITAL_BASED_OUTPATIENT_CLINIC_OR_DEPARTMENT_OTHER): Payer: Self-pay | Admitting: Physical Therapy

## 2023-09-21 ENCOUNTER — Ambulatory Visit (HOSPITAL_BASED_OUTPATIENT_CLINIC_OR_DEPARTMENT_OTHER): Payer: Federal, State, Local not specified - PPO | Attending: Sports Medicine | Admitting: Physical Therapy

## 2023-09-21 DIAGNOSIS — M25512 Pain in left shoulder: Secondary | ICD-10-CM

## 2023-09-21 DIAGNOSIS — M25612 Stiffness of left shoulder, not elsewhere classified: Secondary | ICD-10-CM

## 2023-09-21 DIAGNOSIS — M6281 Muscle weakness (generalized): Secondary | ICD-10-CM

## 2023-09-21 NOTE — Therapy (Signed)
 OUTPATIENT PHYSICAL THERAPY SHOULDER TREATMENT / PROGRESS NOTE   Patient Name: Elizabeth Huerta MRN: [id number] DOB:11-06-1970, 53 y.o., female Today's Date: 09/22/2023  END OF SESSION:  PT End of Session - 09/21/23 0943     Visit Number 8    Number of Visits 12    Date for PT Re-Evaluation 10/19/23    Authorization Type BCBS    PT Start Time 0940    PT Stop Time 1020    PT Time Calculation (min) 40 min    Activity Tolerance Patient tolerated treatment well    Behavior During Therapy [facility] for tasks assessed/performed                Past Medical History:  Diagnosis Date   Allergy    Anxiety 2003   treated   Basal cell carcinoma    nose   C. difficile colitis 06/20/2015   COVID 01/2021   Eczema    History of basal cell carcinoma excision    2010-- nasal area   Hypertension    Hypokalemia    Low vitamin B12 level 2021   Low vitamin D  level    Nephrolithiasis    LEFT   Renal cyst, right    Right ureteral stone    Shingles    Universal ulcerative colitis (HCC) 02/25/2017   Past Surgical History:  Procedure Laterality Date   BUNIONECTOMY WITH TARSAL MEDITARSAL FUSION Right 09/28/2017   CESAREAN SECTION  2001  &  2003   COLONOSCOPY  02/2017   ulcerative colitis   CYSTOSCOPY/RETROGRADE/URETEROSCOPY Right 09/28/2014   Procedure: CYSTOSCOPY RIGHT RETROGRADE RIGHT URETEROSCOPY;  Surgeon: Ricardo Likens, MD;  Location: [facility]  Service: Urology;  Laterality: Right;   EXTRACORPOREAL SHOCK WAVE LITHOTRIPSY Bilateral left 05-15-2010/   right 07-03-2010   HOLMIUM LASER APPLICATION Right 09/28/2014   Procedure: HOLMIUM LASER OF STONES ;  Surgeon: Ricardo Likens, MD;  Location: [facility];  Service: Urology;  Laterality: Right;   MOHS SURGERY  2010   nasal area   MOHS SURGERY  11/2018   nose and upper lip   Patient Active Problem List   Diagnosis Date Noted   Ulcerative colitis with complication (HCC) 10/07/2022   B12 deficiency  03/16/2022   Eczema 12/18/2019   Seborrheic dermatitis 12/18/2019   Nephrolithiasis 12/18/2019   Universal ulcerative colitis (HCC) 02/25/2017    PCP: Ozell Heron HERO, MD  REFERRING PROVIDER: Arnaldo Juliene RAMAN, MD   REFERRING DIAG: [phone] (ICD-10-CM) - Impingement syndrome of left shoulder   THERAPY DIAG:  Left shoulder pain, unspecified chronicity  Muscle weakness (generalized)  Stiffness of left shoulder, not elsewhere classified  Rationale for Evaluation and Treatment: Rehabilitation  ONSET DATE: early October 2024  SUBJECTIVE:  SUBJECTIVE STATEMENT: Pt states she is doing much better than when she started though still has pain.  Pt moved the arms of her desk chair so she would not be shrugging which she thinks has helped.  Pt reports the dry needling helped a lot.  Pt states she had some soreness after prior Rx though not overly sore.    FUNCTIONAL IMPROVEMENTS:  Pt is trying to use her L UE more.  Improved reaching overhead and laterally.  Washing/drying hair.  Lifting objects including milk.  Closing the car door.  FUNCTIONAL LIMITATIONS:  Pt unable to sleep on L shoulder.  1/10 pain with hair care.  Some pain with lifting objects.  Pain with gardening   Hand dominance: Left  PERTINENT HISTORY: Psoriasis Ulcerative colitis R Bunionectomy 2019    PAIN: Are you in pain? no NPRS:  0/10 current, 5/10 worst Location:  L shoulder    PRECAUTIONS: None  RED FLAGS: None   WEIGHT BEARING RESTRICTIONS: No  FALLS:  Has patient fallen in last 6 months? No  LIVING ENVIRONMENT: Lives with: lives with their spouse Lives in: 3 story home Stairs: yes   OCCUPATION: Manufacturing engineer.  Pt stands more than sitting using a standing desk  PLOF: Independent  PATIENT GOALS: pain to go away and to  avoid surgery   OBJECTIVE:  Note: Objective measures were completed at Evaluation unless otherwise noted.  DIAGNOSTIC FINDINGS:  Pt had x rays though PT unable to view the results.  Pt states she has no fractures.    PATIENT SURVEYS:  FOTO Initial/Current:  48/60 with a goal of 69 at visit 13  COGNITION: Overall cognitive status: Within functional limits for tasks assessed      UPPER EXTREMITY ROM:   AROM/PROM Right eval Left eval Left  09/10/23 Left 09/21/23  Shoulder flexion 167 163 with 2/10 pain at end range  162 deg with 2/10 pain at end range  Shoulder scaption 173 150 with 2/10 pain at end range 150 without pain at end range 167 with 2/10 pain at end range  Shoulder abduction 174 81 with 3/10 pain 130 Without pain 150 with 1/10 pain  Shoulder adduction      Shoulder internal rotation 76 78    Shoulder external rotation 79 66/72  68  Elbow flexion      Elbow extension      Wrist flexion      Wrist extension      Wrist ulnar deviation      Wrist radial deviation      Wrist pronation      Wrist supination      (Blank rows = not tested)  UPPER EXTREMITY MMT:  MMT Right eval Left eval Right 09/16/23 Left  09/16/23 Left 09/21/23  Shoulder flexion 5/5 4/5 26.8 18.8 * 5/5  Shoulder scaption 5/5 4/5   5/5  Shoulder abduction       Shoulder adduction       Shoulder internal rotation 5/5 5/5     Shoulder external rotation 5/5 Unable to tolerate, painful 19.2 11.0 * Tolerated min resistance, painful  Middle trapezius       Lower trapezius       Elbow flexion       Elbow extension       Wrist flexion       Wrist extension       Wrist ulnar deviation       Wrist radial deviation       Wrist  pronation       Wrist supination       Grip strength (lbs)       (Blank rows = not tested)   *- with pain  SHOULDER SPECIAL TESTS: Impingement tests:  Neer's:  R: negative, L: positive  Hawkins-Kennedy:  R: negative, L: negative Crossover:  R: negative, L: slightly  positive                                                                                                                              TODAY's TREATMENT: 09/21/23: Reviewed current function, pain levels, HEP compliance, and response to prior Rx. Assessed ROM, strength, and special tests.  See above.    Pt performed: Prone extension with 1# x 10, 2# x 10 reps Prone horizontal abduction 2x10 Supine shoulder ABC x 1 rep with 1# Supine serratus punch 2# 2x10 Supine shoulder horizontal abduction with YTB 2x10     09/16/23: Therapeutic ex: NuStep L4, UE only (UE set at 10) x 4 min for warm up/ ROM Standing bilat shoulder ext with 2# wate bar x 15 Standing LUE D2 flex pattern AAROM with wate bar in front of mirror x 10 Wall push up with elbows in x 5, elbows out x 5 Bent over row with 4# x 12, with cues for scap retraction prior to lifting wt Supine resisted ER with yellow band x 10, range to tolerance Supine resisted horiz abdct/ addct x 25 Supine bilat overhead reach with yellow band between hands x 10 Supine snow angels, range to tolerance x 8; L open book x 6 Manual therapy: STM to L pec maj/ minor, latissimus; TPR to Lt infraspinatus, teres maj  09/14/23: Therapeutic ex:  UBE L2, 1 min forward/ 1 min backward x 2 3 way doorway stretch with LUE, 20s holds, minor cues for form L stretch at counter x 20 sec x 2 L sleeper stretch into IR, 30 sec  x 3 Supine bilat shoulder ext/flex with 3# wate bar x 10, pausing with UE overhead Supine L shoulder abdct (on diagonal) AAROM with cane x 10 Hooklying on 1/2 foam roller:  snow angels to tolerance x 10;  bilat shoulder horiz abdct with red band x 10;  sash D2 flex with red band x 10; bilat ER with red band x 10  Star gazer x 30 sec Open book - varied technique x 10 Standing bilat shoulder ext with 3# wate bar Manual therapy: STM to L pec maj/ minor, latissimus; TPR to Lt infraspinatus, teres maj, subscap  09/10/23: Therapeutic ex:  UBE  L2, 1 min forward/ 1 min backward/ 30 sec forw/backward 3 way doorway stretch with LUE, 20s holds x 2 each - minor cues for form  Manual Therapy: Trigger Point Dry Needling  Initial Treatment: Pt instructed on Dry Needling rational, procedures, and possible side effects. Pt instructed to expect mild to moderate muscle soreness later in the day and/or into the next day.  Pt instructed in methods to reduce muscle soreness. Pt instructed to continue prescribed HEP. Because Dry Needling was performed over or adjacent to a lung field, pt was educated on S/S of pneumothorax and to seek immediate medical attention should they occur.  Patient was educated on signs and symptoms of infection and other risk factors and advised to seek medical attention should they occur.  Patient verbalized understanding of these instructions and education.   Patient Verbal Consent Given: Yes Education Handout Provided: Yes Muscles Treated: pec major Electrical Stimulation Performed: No Treatment Response/Outcome: significant twitch response and palpable increase in soft tissue extensibility  STM to L pec maj/ minor, latissimus; TPR to Lt infraspinatus, teres maj, subscap  Therapeutic ex: shoulder flex/ext holding 2# Wate bar x 10 Open book L x 5 Side stretch over physioball x 5   09/08/23 * Hooklying: snow angels, range to tolerance x 10; shoulder flex/ext holding 2# Wate bar x 10; star gazer stretch x 5-10 sec x 6; Lt protraction x 10  * manual therapy: STM to L pec maj/ minor, latissimus; TPR to Lt infraspinatus, teres maj, subscap * Lt doorway stretch - low, middle (straight arm & bent elbow), high positions x 15s each  * pulleys - into Lt flexion and Lt abdct x 8 reps each, pause into stretch * review of form for L's and W's with scap squeeze x 5 each * L stretch at counter x 5-10s hold x 3 reps  * low row with red band x 12   PATIENT EDUCATION: Education details:  exercise form, anatomy; HEP Person  educated: Patient Education method: Explanation, Demonstration, Tactile cues, Verbal cues, Education comprehension: verbalized understanding, returned demonstration, verbal cues required, tactile cues required, and needs further education  HOME EXERCISE PROGRAM: Exercise Access Code: [email or web address] URL: [email or web address]  Education Access Code: [email or web address] - Trigger Point Dry Needling  ASSESSMENT:  CLINICAL IMPRESSION:  Pt is progressing well in function, pain, and strength.  She reports improved reaching, lifting, and closing the car door.  She is able to perform hair care with improved ease and reduced pain.  Pt does have pain with certain activities including lifting objects and gardening.  Pt demonstrates improved L shoulder scaption and abduction AROM.  Pt demonstrates improved flexion and scaption strength though has continued ER weakness.  Pt continues to have positive Neer's and Crossover impingement testing though had a negative Hawkin's Kennedy Impingement test.  Pt demonstrates improved self perceived disability as evidenced by FOTO score improvement from 48 to 60.  Pt has met STG's #1,3 and LTG #1 and partially met STG #2 and LTG #2.   Pt should benefit from cont skilled PT to address ongoing goals and assist in restoring desired level of function.    OBJECTIVE IMPAIRMENTS: decreased activity tolerance, decreased ROM, decreased strength, hypomobility, impaired flexibility, impaired UE functional use, and pain.   ACTIVITY LIMITATIONS: carrying, lifting, reach over head, and hygiene/grooming  PARTICIPATION LIMITATIONS:   PERSONAL FACTORS: 1 comorbidity: psoriasis  are also affecting patient's functional outcome.   REHAB POTENTIAL: Good  CLINICAL DECISION MAKING: Stable/uncomplicated  EVALUATION COMPLEXITY: Low   GOALS:   SHORT TERM GOALS: Target date: 08/31/2023   Pt will be independent and compliant with HEP for improved pain, ROM, strength, and  function. Baseline: Goal status: GOAL MET  09/21/23  2.  Pt will demo improved L shoulder AROM to at least 160 deg in scaption and 105 deg in abduction for improved UE elevation and reaching. Baseline:  see chart  above  Goal status: Partially met  -09/10/23  3.  Pt will be able to perform hair care without difficulty and without significant pain.  Baseline:  Goal status: GOAL MET  09/21/23    LONG TERM GOALS: Target date: 10/19/2023  Pt will report she is able to perform her normal reaching activities including overhead without significant pain and difficulty.  Baseline:  Goal status: GOAL MET  09/21/23  2.  Pt will demo improved L shoulder strength to 5/5 MMT t/o for improved performance of reaching and functional lifting/carrying.  Baseline:  Goal status:  66% MET   09/21/23  3.  Pt will be able to perform her ADLs and IADLs without significant difficulty and pain.  Baseline:  Goal status: PROGRESSING  09/21/23   PLAN:  PT FREQUENCY: 1-2x/wk  PT DURATION: 4 weeks  PLANNED INTERVENTIONS: 97164- PT Re-evaluation, 97110-Therapeutic exercises, 97530- Therapeutic activity, 97112- Neuromuscular re-education, 97535- Self Care, 02859- Manual therapy, [id number]- Aquatic Therapy, 97014- Electrical stimulation (unattended), [id number]- Electrical stimulation (manual), 97035- Ultrasound, Patient/Family education, Taping, Dry Needling, Joint mobilization, Spinal mobilization, Cryotherapy, and Moist heat  PLAN FOR NEXT SESSION: Scapular strengthening and stabiliziation.  Cont with manual therapy including STM and jt mobs to address mobility deficits and pain.     Leigh Minerva III PT, DPT 09/22/23 5:46 PM

## 2023-09-23 ENCOUNTER — Ambulatory Visit (HOSPITAL_BASED_OUTPATIENT_CLINIC_OR_DEPARTMENT_OTHER): Payer: Federal, State, Local not specified - PPO | Admitting: Physical Therapy

## 2023-09-23 DIAGNOSIS — M6281 Muscle weakness (generalized): Secondary | ICD-10-CM | POA: Diagnosis not present

## 2023-09-23 DIAGNOSIS — M25612 Stiffness of left shoulder, not elsewhere classified: Secondary | ICD-10-CM | POA: Diagnosis not present

## 2023-09-23 DIAGNOSIS — M25512 Pain in left shoulder: Secondary | ICD-10-CM | POA: Diagnosis not present

## 2023-09-23 NOTE — Therapy (Signed)
 OUTPATIENT PHYSICAL THERAPY SHOULDER TREATMENT    Patient Name: Elizabeth Huerta MRN: [id number] DOB:[birth year], 53 y.o., female Today's Date: 09/24/2023  END OF SESSION:  PT End of Session - 09/23/23 0941     Visit Number 9    Number of Visits 12    Date for PT Re-Evaluation 10/19/23    Authorization Type BCBS    PT Start Time [phone]    PT Stop Time 1017    PT Time Calculation (min) 39 min    Activity Tolerance Patient tolerated treatment well    Behavior During Therapy [facility] for tasks assessed/performed                Past Medical History:  Diagnosis Date   Allergy    Anxiety 2003   treated   Basal cell carcinoma    nose   C. difficile colitis 06/20/2015   COVID 01/2021   Eczema    History of basal cell carcinoma excision    2010-- nasal area   Hypertension    Hypokalemia    Low vitamin B12 level 2021   Low vitamin D  level    Nephrolithiasis    LEFT   Renal cyst, right    Right ureteral stone    Shingles    Universal ulcerative colitis (HCC) 02/25/2017   Past Surgical History:  Procedure Laterality Date   BUNIONECTOMY WITH TARSAL MEDITARSAL FUSION Right 09/28/2017   CESAREAN SECTION  2001  &  2003   COLONOSCOPY  02/2017   ulcerative colitis   CYSTOSCOPY/RETROGRADE/URETEROSCOPY Right 09/28/2014   Procedure: CYSTOSCOPY RIGHT RETROGRADE RIGHT URETEROSCOPY;  Surgeon: Ricardo Likens, MD;  Location: [facility];  Service: Urology;  Laterality: Right;   EXTRACORPOREAL SHOCK WAVE LITHOTRIPSY Bilateral left 05-15-2010/   right 07-03-2010   HOLMIUM LASER APPLICATION Right 09/28/2014   Procedure: HOLMIUM LASER OF STONES ;  Surgeon: Ricardo Likens, MD;  Location: [facility];  Service: Urology;  Laterality: Right;   MOHS SURGERY  2010   nasal area   MOHS SURGERY  11/2018   nose and upper lip   Patient Active Problem List   Diagnosis Date Noted   Ulcerative colitis with complication (HCC) 10/07/2022   B12 deficiency 03/16/2022   Eczema  12/18/2019   Seborrheic dermatitis 12/18/2019   Nephrolithiasis 12/18/2019   Universal ulcerative colitis (HCC) 02/25/2017    PCP: Ozell Heron HERO, MD  REFERRING PROVIDER: Arnaldo Juliene RAMAN, MD   REFERRING DIAG: [phone] (ICD-10-CM) - Impingement syndrome of left shoulder   THERAPY DIAG:  Left shoulder pain, unspecified chronicity  Muscle weakness (generalized)  Stiffness of left shoulder, not elsewhere classified  Rationale for Evaluation and Treatment: Rehabilitation  ONSET DATE: early October 2024  SUBJECTIVE:  SUBJECTIVE STATEMENT: Pt denies any adverse effects after prior Rx.  Pt moved the arms of her desk chair so she would not be shrugging which she thinks has helped.     FUNCTIONAL IMPROVEMENTS:  Pt is trying to use her L UE more.  Improved reaching overhead and laterally.  Washing/drying hair.  Lifting objects including milk.  Closing the car door.  FUNCTIONAL LIMITATIONS:  Pt unable to sleep on L shoulder.  1/10 pain with hair care.  Some pain with lifting objects.  Pain with gardening   Hand dominance: Left  PERTINENT HISTORY: Psoriasis Ulcerative colitis R Bunionectomy 2019    PAIN: Are you in pain? no NPRS:  0/10 current, 5/10 worst Location:  L shoulder    PRECAUTIONS: None  RED FLAGS: None   WEIGHT BEARING RESTRICTIONS: No  FALLS:  Has patient fallen in last 6 months? 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  DIAGNOSTIC FINDINGS:  Pt had x rays though PT unable to  view the results.  Pt states she has no fractures.    PATIENT SURVEYS:  FOTO Initial/Current:  48/60 with a goal of 69 at visit 13  COGNITION: Overall cognitive status: Within functional limits for tasks assessed      UPPER EXTREMITY ROM:   AROM/PROM Right eval Left eval Left  09/10/23 Left 09/21/23  Shoulder flexion 167 163 with 2/10 pain at end range  162 deg with 2/10 pain at end range  Shoulder scaption 173 150 with 2/10 pain at end range 150 without pain at end range 167 with 2/10 pain at end range  Shoulder abduction 174 81 with 3/10 pain 130 Without pain 150 with 1/10 pain  Shoulder adduction      Shoulder internal rotation 76 78    Shoulder external rotation 79 66/72  68  Elbow flexion      Elbow extension      Wrist flexion      Wrist extension      Wrist ulnar deviation      Wrist radial deviation      Wrist pronation      Wrist supination      (Blank rows = not tested)  UPPER EXTREMITY MMT:  MMT Right eval Left eval Right 09/16/23 Left  09/16/23 Left 09/21/23  Shoulder flexion 5/5 4/5 26.8 18.8 * 5/5  Shoulder scaption 5/5 4/5   5/5  Shoulder abduction       Shoulder adduction       Shoulder internal rotation 5/5 5/5     Shoulder external rotation 5/5 Unable to tolerate, painful 19.2 11.0 * Tolerated min resistance, painful  Middle trapezius       Lower trapezius       Elbow flexion       Elbow extension       Wrist flexion       Wrist extension       Wrist ulnar deviation       Wrist radial deviation       Wrist pronation       Wrist supination       Grip strength (lbs)       (Blank rows = not tested)   *-  with pain  SHOULDER SPECIAL TESTS: Impingement tests:  Neer's:  R: negative, L: positive  Hawkins-Kennedy:  R: negative, L: negative Crossover:  R: negative, L: slightly positive                                                                                                                              TODAY's TREATMENT: 09/23/2023 Ther  ex: UBE L1 x 4 mins (2 min forward/2 min backward) Prone extension 2# x 10 reps Prone horizontal abduction 2x10 Supine serratus punch 2# x15, 3#  2x10 Shoulder ER with scap bilat with YTB 2x10  Neuro Re-ed: Supine shoulder ABC x 1 rep with 1# 4D ball rolls on wall 2x10 each ABC's with ball on wall x 1 rep  Manual Therapy:  STM to infraspinatus and teres seated and to Lats in supine   09/21/23: Reviewed current function, pain levels, HEP compliance, and response to prior Rx. Assessed ROM, strength, and special tests.  See above.    Pt performed: Prone extension with 1# x 10, 2# x 10 reps Prone horizontal abduction 2x10 Supine shoulder ABC x 1 rep with 1# Supine serratus punch 2# 2x10 Supine shoulder horizontal abduction with YTB 2x10     09/16/23: Therapeutic ex: NuStep L4, UE only (UE set at 10) x 4 min for warm up/ ROM Standing bilat shoulder ext with 2# wate bar x 15 Standing LUE D2 flex pattern AAROM with wate bar in front of mirror x 10 Wall push up with elbows in x 5, elbows out x 5 Bent over row with 4# x 12, with cues for scap retraction prior to lifting wt Supine resisted ER with yellow band x 10, range to tolerance Supine resisted horiz abdct/ addct x 25 Supine bilat overhead reach with yellow band between hands x 10 Supine snow angels, range to tolerance x 8; L open book x 6 Manual therapy: STM to L pec maj/ minor, latissimus; TPR to Lt infraspinatus, teres maj  09/14/23: Therapeutic ex:  UBE L2, 1 min forward/ 1 min backward x 2 3 way doorway stretch with LUE, 20s holds, minor cues for form L stretch at counter x 20 sec x 2 L sleeper stretch into IR, 30 sec  x 3 Supine bilat shoulder ext/flex with 3# wate bar x 10, pausing with UE overhead Supine L shoulder abdct (on diagonal) AAROM with cane x 10 Hooklying on 1/2 foam roller:  snow angels to tolerance x 10;  bilat shoulder horiz abdct with red band x 10;  sash D2 flex with red band x 10; bilat ER with red  band x 10  Star gazer x 30 sec Open book - varied technique x 10 Standing bilat shoulder ext with 3# wate bar Manual therapy: STM to L pec maj/ minor, latissimus; TPR to Lt infraspinatus, teres maj, subscap   PATIENT EDUCATION: Education details:  exercise form, anatomy; HEP Person educated: Patient Education method: Explanation, Demonstration, Tactile cues, Verbal  cues, Education comprehension: verbalized understanding, returned demonstration, verbal cues required, tactile cues required, and needs further education  HOME EXERCISE PROGRAM: Exercise Access Code: [email or web address] URL: [email or web address]  Education Access Code: [email or web address] - Trigger Point Dry Needling  ASSESSMENT:  CLINICAL IMPRESSION:  Pt is progressing well in function, pain, and strength.  Pt performed ther ex and neuro re-ed activities well with cuing and instruction in correct form.  She responded well to Rx stating she felt more loose after treatment.  She denies pain and has no c/o's after treatment.  Pt should benefit from cont skilled PT to address ongoing goals and assist in restoring desired level of function.     OBJECTIVE IMPAIRMENTS: decreased activity tolerance, decreased ROM, decreased strength, hypomobility, impaired flexibility, impaired UE functional use, and pain.   ACTIVITY LIMITATIONS: carrying, lifting, reach over head, and hygiene/grooming  PARTICIPATION LIMITATIONS:   PERSONAL FACTORS: 1 comorbidity: psoriasis  are also affecting patient's functional outcome.   REHAB POTENTIAL: Good  CLINICAL DECISION MAKING: Stable/uncomplicated  EVALUATION COMPLEXITY: Low   GOALS:   SHORT TERM GOALS: Target date: 08/31/2023   Pt will be independent and compliant with HEP for improved pain, ROM, strength, and function. Baseline: Goal status: GOAL MET  09/21/23  2.  Pt will demo improved L shoulder AROM to at least 160 deg in scaption and 105 deg in abduction for improved UE elevation and  reaching. Baseline:  see chart above  Goal status: Partially met  -09/10/23  3.  Pt will be able to perform hair care without difficulty and without significant pain.  Baseline:  Goal status: GOAL MET  09/21/23    LONG TERM GOALS: Target date: 10/19/2023  Pt will report she is able to perform her normal reaching activities including overhead without significant pain and difficulty.  Baseline:  Goal status: GOAL MET  09/21/23  2.  Pt will demo improved L shoulder strength to 5/5 MMT t/o for improved performance of reaching and functional lifting/carrying.  Baseline:  Goal status:  66% MET   09/21/23  3.  Pt will be able to perform her ADLs and IADLs without significant difficulty and pain.  Baseline:  Goal status: PROGRESSING  09/21/23   PLAN:  PT FREQUENCY: 1-2x/wk  PT DURATION: 4 weeks  PLANNED INTERVENTIONS: 97164- PT Re-evaluation, 97110-Therapeutic exercises, 97530- Therapeutic activity, 97112- Neuromuscular re-education, 97535- Self Care, 02859- Manual therapy, [id number]- Aquatic Therapy, 97014- Electrical stimulation (unattended), [id number]- Electrical stimulation (manual), 97035- Ultrasound, Patient/Family education, Taping, Dry Needling, Joint mobilization, Spinal mobilization, Cryotherapy, and Moist heat  PLAN FOR NEXT SESSION: Scapular strengthening and stabiliziation.  Cont with manual therapy including STM and jt mobs to address mobility deficits and pain.     Leigh Minerva III PT, DPT 09/24/23 12:11 PM

## 2023-09-24 ENCOUNTER — Encounter (HOSPITAL_BASED_OUTPATIENT_CLINIC_OR_DEPARTMENT_OTHER): Payer: Self-pay | Admitting: Physical Therapy

## 2023-09-28 ENCOUNTER — Ambulatory Visit (HOSPITAL_BASED_OUTPATIENT_CLINIC_OR_DEPARTMENT_OTHER): Payer: Federal, State, Local not specified - PPO | Admitting: Physical Therapy

## 2023-09-30 ENCOUNTER — Ambulatory Visit (HOSPITAL_BASED_OUTPATIENT_CLINIC_OR_DEPARTMENT_OTHER): Payer: Federal, State, Local not specified - PPO | Admitting: Physical Therapy

## 2023-09-30 ENCOUNTER — Encounter (HOSPITAL_BASED_OUTPATIENT_CLINIC_OR_DEPARTMENT_OTHER): Payer: Self-pay | Admitting: Physical Therapy

## 2023-09-30 DIAGNOSIS — M25612 Stiffness of left shoulder, not elsewhere classified: Secondary | ICD-10-CM

## 2023-09-30 DIAGNOSIS — M25512 Pain in left shoulder: Secondary | ICD-10-CM

## 2023-09-30 DIAGNOSIS — M6281 Muscle weakness (generalized): Secondary | ICD-10-CM | POA: Diagnosis not present

## 2023-09-30 NOTE — Therapy (Signed)
OUTPATIENT PHYSICAL THERAPY SHOULDER TREATMENT    Patient Name: Elizabeth Huerta MRN: [id number] DOB:17-Feb-1971, 53 y.o., female Today's Date: 10/01/2023  END OF SESSION:  PT End of Session - 09/30/23 0940     Visit Number 10    Number of Visits 12    Date for PT Re-Evaluation 10/19/23    Authorization Type BCBS    PT Start Time 0933    PT Stop Time 1014    PT Time Calculation (min) 41 min    Activity Tolerance Patient tolerated treatment well    Behavior During Therapy [facility] for tasks assessed/performed                Past Medical History:  Diagnosis Date   Allergy    Anxiety 2003   treated   Basal cell carcinoma    nose   C. 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   Past Surgical History:  Procedure Laterality Date   BUNIONECTOMY WITH TARSAL MEDITARSAL FUSION Right 09/28/2017   CESAREAN SECTION  2001  &  2003   COLONOSCOPY  02/2017   ulcerative colitis   CYSTOSCOPY/RETROGRADE/URETEROSCOPY Right 09/28/2014   Procedure: CYSTOSCOPY RIGHT RETROGRADE RIGHT URETEROSCOPY;  Surgeon: Sebastian Ache, MD;  Location: [facility];  Service: Urology;  Laterality: Right;   EXTRACORPOREAL SHOCK WAVE LITHOTRIPSY Bilateral left 05-15-2010/   right 07-03-2010   HOLMIUM LASER APPLICATION Right 09/28/2014   Procedure: HOLMIUM LASER OF STONES ;  Surgeon: Sebastian Ache, MD;  Location: [facility];  Service: Urology;  Laterality: Right;   MOHS SURGERY  2010   nasal area   MOHS SURGERY  11/2018   nose and upper lip   Patient Active Problem List   Diagnosis Date Noted   Ulcerative colitis with complication (HCC) 10/07/2022   B12 deficiency 03/16/2022    Eczema 12/18/2019   Seborrheic dermatitis 12/18/2019   Nephrolithiasis 12/18/2019   Universal ulcerative colitis (HCC) 02/25/2017    PCP: Karie Georges, MD  REFERRING PROVIDER: Delfin Gant, MD   REFERRING DIAG: [phone] (ICD-10-CM) - Impingement syndrome of left shoulder   THERAPY DIAG:  Left shoulder pain, unspecified chronicity  Muscle weakness (generalized)  Stiffness of left shoulder, not elsewhere classified  Rationale for Evaluation and Treatment: Rehabilitation  ONSET DATE: early October 2024  SUBJECTIVE:  SUBJECTIVE STATEMENT: Pt denies any adverse effects after prior Rx.  Pt reports improved donning a shirt.  Pt denies pain currently.       Hand dominance: Left  PERTINENT HISTORY: Psoriasis Ulcerative colitis R Bunionectomy 2019    PAIN: Are you in pain? no NPRS:  0/10 current, 5/10 worst Location:  L shoulder    PRECAUTIONS: None  RED FLAGS: None   WEIGHT BEARING RESTRICTIONS: No  FALLS:  Has patient fallen in last 6 months? 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  DIAGNOSTIC FINDINGS:  Pt had x rays though PT unable to view the results.  Pt states she has no fractures.     UPPER EXTREMITY MMT:  MMT Right eval Left eval Right 09/16/23 Left  09/16/23 Left 09/21/23 Left 2/13  Shoulder flexion 5/5 4/5 26.8 18.8 * 5/5   Shoulder scaption 5/5 4/5   5/5   Shoulder abduction        Shoulder adduction        Shoulder internal rotation 5/5 5/5      Shoulder  external rotation 5/5 Unable to tolerate, painful 19.2 11.0 * Tolerated min resistance, painful 4-/5 with 4-5/10 pain  Middle trapezius        Lower trapezius        Elbow flexion        Elbow extension        Wrist flexion        Wrist extension        Wrist ulnar deviation        Wrist radial deviation        Wrist pronation        Wrist supination        Grip strength (lbs)        (Blank rows = not tested)   *- with pain                                                                                                                               TODAY's TREATMENT: 09/30/2023 Ther ex: UBE L1 x 4 mins (2 min forward/2 min backward) Prone extension 2# x 10 reps, 3# x10, 12 reps Prone horizontal abduction 2x10 Supine serratus punch 3# x15, 4# x12, 5#  x10 S/L ER x10 with 0# with 1/10 pain, 1# x 10  with 2-3/10 Shoulder ER with scap bilat with YTB 2x10 Supine shoulder horiz abd with RTB 2x10  Assessed ER strength  Neuro Re-ed: 4D ball rolls on wall 2x10 each ABC's with ball on wall x 1 rep Standing scap protraction and retraction with hands on wall 2x10    PATIENT EDUCATION:  Education details:  exercise form, relevant anatomy, strength findings, rationale of interventions, POC, and HEP Person educated: Patient Education method: Explanation, Demonstration, Tactile cues, Verbal cues, Education comprehension: verbalized understanding, returned demonstration, verbal cues required, tactile cues required, and needs further education  HOME EXERCISE PROGRAM: Exercise Access Code: [email or web address] URL: [email or web address]  Education Access Code: [email or web address] - Trigger Point Dry Needling  ASSESSMENT:  CLINICAL IMPRESSION:  Pt is making great progress in PT.  She is improving with strength as evidenced by performance of exercises with increased resistance.  She demonstrates improved ER strength as evidenced by MMT.  PT had pt performed resisted ER today.  Pt reports 1/10  pain with S/L ER without resistance and a 2-3/10 pain with a 1# weight.  Pt performed exercises well with cuing and instruction in correct form and positioning.  She responded well to Rx having no c/o's after Rx.  PT to plan for discharge in 2 visits.      OBJECTIVE IMPAIRMENTS: decreased activity tolerance, decreased ROM, decreased strength, hypomobility, impaired flexibility, impaired UE functional use, and pain.   ACTIVITY LIMITATIONS: carrying, lifting, reach over head, and hygiene/grooming  PARTICIPATION LIMITATIONS:   PERSONAL FACTORS: 1 comorbidity: psoriasis  are also affecting patient's functional outcome.   REHAB POTENTIAL: Good  CLINICAL DECISION MAKING: Stable/uncomplicated  EVALUATION COMPLEXITY: Low   GOALS:   SHORT TERM GOALS: Target date: 08/31/2023   Pt will be independent and compliant with HEP for improved pain, ROM, strength, and function. Baseline: Goal status: GOAL MET  09/21/23  2.  Pt will demo improved L shoulder AROM to at least 160 deg in scaption and 105 deg in abduction for improved UE elevation and reaching. Baseline:  see chart above  Goal status: Partially met  -09/10/23  3.  Pt will be able to perform hair care without difficulty and without significant pain.  Baseline:  Goal status: GOAL MET  09/21/23    LONG TERM GOALS: Target date: 10/19/2023  Pt will report she is able to perform her normal reaching activities including overhead without significant pain and difficulty.  Baseline:  Goal status: GOAL MET  09/21/23  2.  Pt will demo improved L shoulder strength to 5/5 MMT t/o for improved performance of reaching and functional lifting/carrying.  Baseline:  Goal status:  66% MET   09/21/23  3.  Pt will be able to perform her ADLs and IADLs without significant difficulty and pain.  Baseline:  Goal status: PROGRESSING  09/21/23   PLAN:  PT FREQUENCY: 1-2x/wk  PT DURATION: 4 weeks  PLANNED INTERVENTIONS: 97164- PT Re-evaluation,  97110-Therapeutic exercises, 97530- Therapeutic activity, 97112- Neuromuscular re-education, 97535- Self Care, 13244- Manual therapy, [id number]- Aquatic Therapy, 97014- Electrical stimulation (unattended), [id number]- Electrical stimulation (manual), 97035- Ultrasound, Patient/Family education, Taping, Dry Needling, Joint mobilization, Spinal mobilization, Cryotherapy, and Moist heat  PLAN FOR NEXT SESSION:  Pt to be seen 1x/wk for 2 more weeks.  Possible discharge in 2 visits.  Scapular strengthening and stabiliziation.  Cont with manual therapy including STM and jt mobs to address mobility deficits and pain.     Audie Clear III PT, DPT 10/01/23 9:57 AM

## 2023-10-06 ENCOUNTER — Ambulatory Visit (HOSPITAL_BASED_OUTPATIENT_CLINIC_OR_DEPARTMENT_OTHER): Payer: Federal, State, Local not specified - PPO | Admitting: Physical Therapy

## 2023-10-06 ENCOUNTER — Encounter (HOSPITAL_BASED_OUTPATIENT_CLINIC_OR_DEPARTMENT_OTHER): Payer: Self-pay | Admitting: Physical Therapy

## 2023-10-06 DIAGNOSIS — M6281 Muscle weakness (generalized): Secondary | ICD-10-CM

## 2023-10-06 DIAGNOSIS — M25612 Stiffness of left shoulder, not elsewhere classified: Secondary | ICD-10-CM | POA: Diagnosis not present

## 2023-10-06 DIAGNOSIS — M25512 Pain in left shoulder: Secondary | ICD-10-CM

## 2023-10-06 NOTE — Therapy (Signed)
 OUTPATIENT PHYSICAL THERAPY SHOULDER TREATMENT    Patient Name: Elizabeth Huerta MRN: [id number] DOB:[birth year], 53 y.o., female Today's Date: 10/06/2023  END OF SESSION:  PT End of Session - 10/06/23 0804     Visit Number 11    Number of Visits 12    Date for PT Re-Evaluation 10/19/23    Authorization Type BCBS    PT Start Time 0800    PT Stop Time 0846    PT Time Calculation (min) 46 min    Activity Tolerance Patient tolerated treatment well    Behavior During Therapy [facility] for tasks assessed/performed                 Past Medical History:  Diagnosis Date   Allergy    Anxiety 2003   treated   Basal cell carcinoma    nose   C. 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   Past Surgical History:  Procedure Laterality Date   BUNIONECTOMY WITH TARSAL MEDITARSAL FUSION Right 09/28/2017   CESAREAN SECTION  2001  &  2003   COLONOSCOPY  02/2017   ulcerative colitis   CYSTOSCOPY/RETROGRADE/URETEROSCOPY Right 09/28/2014   Procedure: CYSTOSCOPY RIGHT RETROGRADE RIGHT URETEROSCOPY;  Surgeon: Sebastian Ache, MD;  Location: [facility];  Service: Urology;  Laterality: Right;   EXTRACORPOREAL SHOCK WAVE LITHOTRIPSY Bilateral left 05-15-2010/   right 07-03-2010   HOLMIUM LASER APPLICATION Right 09/28/2014   Procedure: HOLMIUM LASER OF STONES ;  Surgeon: Sebastian Ache, MD;  Location: [facility];  Service: Urology;  Laterality: Right;   MOHS SURGERY  2010   nasal area   MOHS SURGERY  11/2018   nose and upper lip   Patient Active Problem List   Diagnosis Date Noted   Ulcerative colitis with complication (HCC) 10/07/2022   B12 deficiency 03/16/2022    Eczema 12/18/2019   Seborrheic dermatitis 12/18/2019   Nephrolithiasis 12/18/2019   Universal ulcerative colitis (HCC) 02/25/2017    PCP: Karie Georges, MD  REFERRING PROVIDER: Delfin Gant, MD   REFERRING DIAG: [phone] (ICD-10-CM) - Impingement syndrome of left shoulder   THERAPY DIAG:  Left shoulder pain, unspecified chronicity  Muscle weakness (generalized)  Stiffness of left shoulder, not elsewhere classified  Rationale for Evaluation and Treatment: Rehabilitation  ONSET DATE: early October 2024  SUBJECTIVE:  SUBJECTIVE STATEMENT: Pt denies any adverse effects after prior Rx, may have had a little soreness.  Pt reports her shoulder feels better.  Pt denies pain currently.  "I'm thrilled with my progress."      Hand dominance: Left  PERTINENT HISTORY: Psoriasis Ulcerative colitis R Bunionectomy 2019    PAIN: Are you in pain? no NPRS:  0/10 current, 5/10 worst Location:  L shoulder    PRECAUTIONS: None  RED FLAGS: None   WEIGHT BEARING RESTRICTIONS: No  FALLS:  Has patient fallen in last 6 months? 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  DIAGNOSTIC FINDINGS:  Pt had x rays though PT unable to view the results.  Pt states she has no fractures.                                                                                                                               TODAY's TREATMENT:  Ther ex: UBE L1 x 4 mins (2 min forward/2 min backward) Prone extension 3#  3x12 reps Prone horizontal abduction 2x10  S/L ER x10 with 0# with no pain, 1# 2 x 10  with 1/10 Shoulder ER with scap bilat with RTB x10 Standing ER with arm at side 1x10 with RTB  Updated HEP.  Pt received a HEP handout and was educated in correct form and appropriate frequency.   Neuro Re-ed: 4D ball rolls on wall 2x10 each ABC's with ball on wall x 1 rep Standing scap protraction and retraction with hands on wall 2x10  Manual Therapy: Pt received STM to L infraspinatus and teres seated.    PATIENT EDUCATION: Education details:  exercise form, relevant anatomy, rationale of interventions, POC, and HEP Person educated: Patient Education method: Explanation, Demonstration, Tactile cues, Verbal cues, Education comprehension: verbalized understanding, returned demonstration, verbal cues required, tactile cues required, and needs further education  HOME EXERCISE PROGRAM: Exercise Access Code: [email or web address] URL: [email or web address]  Education Access Code: [email or web address] - Trigger Point Dry Needling  Updated HEP: - Wall Serratus plus  - 1 x daily - 5-7 x weekly - 2 sets - 10 reps - Standing Wall [employer] with Mini Swiss Ball  - 1 x daily - 5-6 x weekly  ASSESSMENT:  CLINICAL IMPRESSION:  Pt is making good progress with pain and sx's.  She performed ther ex and neuro re-ed activities well with cuing for correct form and positioning.  PT had pt perform different exercises to strengthen ER.  Pt did have some stinging toward the end of S/L ER with a 1# weight, but had decreased pain with S/L ER today.  Pt has improved tolerance with resisted ER.  PT updated HEP and gave pt a HEP handout.  She demonstrates good understanding of HEP.  Pt had no tenderness with STM to post scap mm.  Pt responded well to Rx having no pain after Rx.  Pt may be ready for discharge next visit.    OBJECTIVE IMPAIRMENTS: decreased activity tolerance, decreased ROM, decreased strength, hypomobility,  impaired flexibility, impaired UE functional use, and pain.   ACTIVITY LIMITATIONS: carrying, lifting, reach over head, and hygiene/grooming  PARTICIPATION LIMITATIONS:   PERSONAL FACTORS: 1 comorbidity: psoriasis  are also affecting patient's functional outcome.   REHAB POTENTIAL: Good  CLINICAL DECISION MAKING: Stable/uncomplicated  EVALUATION COMPLEXITY: Low   GOALS:   SHORT TERM GOALS: Target date: 08/31/2023   Pt will be independent and compliant with HEP for improved pain, ROM, strength, and function. Baseline: Goal status: GOAL MET  09/21/23  2.  Pt will demo improved L shoulder AROM to at least 160 deg in scaption and 105 deg in abduction for improved UE elevation and reaching. Baseline:  see chart above  Goal status: Partially met  -09/10/23  3.  Pt will be able to perform hair care without difficulty and without significant pain.  Baseline:  Goal status: GOAL MET  09/21/23    LONG TERM GOALS: Target date: 10/19/2023  Pt will report she is able to perform her normal reaching activities including overhead without significant pain and difficulty.  Baseline:  Goal status: GOAL MET  09/21/23  2.  Pt will demo improved L shoulder strength to 5/5 MMT t/o for improved performance of reaching and functional lifting/carrying.  Baseline:  Goal status:  66% MET   09/21/23  3.  Pt will be able to perform her ADLs and IADLs without significant difficulty and pain.  Baseline:  Goal status: PROGRESSING  09/21/23   PLAN:  PT FREQUENCY: 1-2x/wk  PT DURATION: 4 weeks  PLANNED INTERVENTIONS: 97164- PT Re-evaluation, 97110-Therapeutic exercises, 97530- Therapeutic activity, 97112- Neuromuscular re-education, 97535- Self Care, 16109- Manual therapy, [id number]- Aquatic Therapy, 97014- Electrical stimulation (unattended), [id number]- Electrical stimulation (manual), 97035- Ultrasound, Patient/Family education, Taping, Dry Needling, Joint mobilization, Spinal mobilization, Cryotherapy, and Moist  heat  PLAN FOR NEXT SESSION:  Possible discharge next visit.  Scapular strengthening and stabiliziation. HEP.   Audie Clear III PT, DPT 10/06/23 9:43 AM

## 2023-10-08 ENCOUNTER — Encounter (HOSPITAL_BASED_OUTPATIENT_CLINIC_OR_DEPARTMENT_OTHER): Payer: Federal, State, Local not specified - PPO | Admitting: Physical Therapy

## 2023-10-11 ENCOUNTER — Encounter (HOSPITAL_BASED_OUTPATIENT_CLINIC_OR_DEPARTMENT_OTHER): Payer: Federal, State, Local not specified - PPO | Admitting: Physical Therapy

## 2023-10-13 ENCOUNTER — Encounter (HOSPITAL_BASED_OUTPATIENT_CLINIC_OR_DEPARTMENT_OTHER): Payer: Self-pay | Admitting: Physical Therapy

## 2023-10-13 ENCOUNTER — Ambulatory Visit (HOSPITAL_BASED_OUTPATIENT_CLINIC_OR_DEPARTMENT_OTHER): Payer: Federal, State, Local not specified - PPO | Admitting: Physical Therapy

## 2023-10-13 DIAGNOSIS — M25512 Pain in left shoulder: Secondary | ICD-10-CM

## 2023-10-13 DIAGNOSIS — M6281 Muscle weakness (generalized): Secondary | ICD-10-CM | POA: Diagnosis not present

## 2023-10-13 DIAGNOSIS — M25612 Stiffness of left shoulder, not elsewhere classified: Secondary | ICD-10-CM

## 2023-10-13 NOTE — Therapy (Signed)
 OUTPATIENT PHYSICAL THERAPY SHOULDER TREATMENT    Patient Name: Elizabeth Huerta MRN: [id number] DOB:March 01, 1971, 53 y.o., female Today's Date: 10/13/2023  END OF SESSION:  PT End of Session - 10/13/23 0834     Visit Number 12    Number of Visits 12    Date for PT Re-Evaluation 10/19/23    Authorization Type BCBS    PT Start Time 0805    PT Stop Time 0849    PT Time Calculation (min) 44 min    Activity Tolerance Patient tolerated treatment well    Behavior During Therapy [facility] for tasks assessed/performed                  Past Medical History:  Diagnosis Date   Allergy    Anxiety 2003   treated   Basal cell carcinoma    nose   C. 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   Past Surgical History:  Procedure Laterality Date   BUNIONECTOMY WITH TARSAL MEDITARSAL FUSION Right 09/28/2017   CESAREAN SECTION  2001  &  2003   COLONOSCOPY  02/2017   ulcerative colitis   CYSTOSCOPY/RETROGRADE/URETEROSCOPY Right 09/28/2014   Procedure: CYSTOSCOPY RIGHT RETROGRADE RIGHT URETEROSCOPY;  Surgeon: Sebastian Ache, MD;  Location: [facility];  Service: Urology;  Laterality: Right;   EXTRACORPOREAL SHOCK WAVE LITHOTRIPSY Bilateral left 05-15-2010/   right 07-03-2010   HOLMIUM LASER APPLICATION Right 09/28/2014   Procedure: HOLMIUM LASER OF STONES ;  Surgeon: Sebastian Ache, MD;  Location: [facility];  Service: Urology;  Laterality: Right;   MOHS SURGERY  2010   nasal area   MOHS SURGERY  11/2018   nose and upper lip   Patient Active Problem List   Diagnosis Date Noted   Ulcerative colitis with complication (HCC) 10/07/2022   B12 deficiency 03/16/2022    Eczema 12/18/2019   Seborrheic dermatitis 12/18/2019   Nephrolithiasis 12/18/2019   Universal ulcerative colitis (HCC) 02/25/2017    PCP: Karie Georges, MD  REFERRING PROVIDER: Delfin Gant, MD   REFERRING DIAG: [phone] (ICD-10-CM) - Impingement syndrome of left shoulder   THERAPY DIAG:  Left shoulder pain, unspecified chronicity  Muscle weakness (generalized)  Stiffness of left shoulder, not elsewhere classified  Rationale for Evaluation and Treatment: Rehabilitation  ONSET DATE: early October 2024  SUBJECTIVE:  SUBJECTIVE STATEMENT: Pt denies any adverse effects after prior Rx.  Pt states her shoulder is doing well.  Pt denies pain currently.  Pt is able to reach and grab milk out of the fridge without even thinking about it.  Pt is using her L UE more without thinking about it and without pain.  Pt reports compliance with HEP.  Pt states she did well with the new home exercises.  Pt is very appreciative of skilled PT services.      Hand dominance: Left  PERTINENT HISTORY: Psoriasis Ulcerative colitis R Bunionectomy 2019    PAIN: Are you in pain? no NPRS:  0/10 current, 3/10 worst Location:  L shoulder    PRECAUTIONS: None  RED FLAGS: None   WEIGHT BEARING RESTRICTIONS: No  FALLS:  Has patient fallen in last 6 months? 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  DIAGNOSTIC FINDINGS:  Pt had x rays though PT unable to view the results.  Pt states she has no fractures.                                                                                                                                TODAY's TREATMENT:  L shoulder flexion AROM:  162 deg ER strength:  4/5 with 2/10 pain  Ther ex: UBE L1 x 4 mins (2 min forward/2 min backward) Prone extension 3# 3x12 reps Prone horizontal abduction 2x10  Shoulder ER with scap bilat with RTB 2x10 Standing ER with arm at side 2x10 with RTB  Updated HEP.  Pt received a HEP handout and was educated in correct form and appropriate frequency.   Standing scap protraction and retraction with hands on wall 2x10  FOTO:  Prior / Current:  60 / 70.  Goal of 69.    PATIENT EDUCATION: Education details:  exercise form, relevant anatomy, rationale of interventions, POC, and HEP Person educated: Patient Education method: Explanation, Demonstration, Tactile cues, Verbal cues, Education comprehension: verbalized understanding, returned demonstration, verbal cues required, tactile cues required, and needs further education  HOME EXERCISE PROGRAM: Exercise Access Code: [email or web address] URL: [email or web address]  Education Access Code: [email or web address] - Trigger Point Dry Needling  Updated HEP: - Prone Shoulder Extension - Single Arm with Dumbbell  - 1 x daily - 3-4 x weekly - 2-3 sets - 10 reps - Single Arm Serratus Punches  - 1 x daily - 3 x weekly - 3 sets - 10 reps - Prone Single Arm Shoulder Horizontal Abduction with Scapular Retraction and Palm Down  - 1 x daily - 4 x weekly - 2 sets - 10 reps - Shoulder External Rotation and Scapular Retraction  with Resistance  - 1 x daily - 3-4 x weekly - 2 sets - 10 reps  ASSESSMENT:  CLINICAL IMPRESSION:  Pt has made excellent progress in PT.  Pt has significantly improved pain with daily activities and functional usage of L UE.  She reports improved worst pain.  Pt is using her L UE more normally without even thinking about it including reaching to grab milk out of the  fridge. Though pt continues to have weakness in ER, she is improving with strength and having less pain with strength testing.  Pt has improved tolerance with exercises.  Pt is compliant with HEP.  PT thoroughly went through HEP today educating pt.  PT updated HEP and gave pt a handout.  PT educated pt in correct exercises, correct form, appropriate resistance, and appropriate frequency.  Pt is independent with HEP.  Pt demonstrates improved self perceived disability with FOTO score improving from 60 prior to 70 currently.  Pt met her FOTO goal.  Pt has met all goals except partially meeting LTG #2.  Pt is ready for discharge.    OBJECTIVE IMPAIRMENTS: decreased activity tolerance, decreased ROM, decreased strength, hypomobility, impaired flexibility, impaired UE functional use, and pain.   ACTIVITY LIMITATIONS: carrying, lifting, reach over head, and hygiene/grooming  PARTICIPATION LIMITATIONS:   PERSONAL FACTORS: 1 comorbidity: psoriasis  are also affecting patient's functional outcome.   REHAB POTENTIAL: Good  CLINICAL DECISION MAKING: Stable/uncomplicated  EVALUATION COMPLEXITY: Low   GOALS:   SHORT TERM GOALS: Target date: 08/31/2023   Pt will be independent and compliant with HEP for improved pain, ROM, strength, and function. Baseline: Goal status: GOAL MET  09/21/23  2.  Pt will demo improved L shoulder AROM to at least 160 deg in scaption and 105 deg in abduction for improved UE elevation and reaching. Baseline:  see chart above  Goal status:  GOAL met   3.  Pt will be able to perform hair care without difficulty and without significant pain.  Baseline:  Goal status: GOAL MET  09/21/23    LONG TERM GOALS: Target date: 10/19/2023  Pt will report she is able to perform her normal reaching activities including overhead without significant pain and difficulty.  Baseline:  Goal status: GOAL MET  09/21/23  2.  Pt will demo improved L shoulder strength to 5/5 MMT t/o for improved  performance of reaching and functional lifting/carrying.  Baseline:  Goal status:  66% MET   09/21/23  3.  Pt will be able to perform her ADLs and IADLs without significant difficulty and pain.  Baseline:  Goal status: GOAL MET  10/13/23   PLAN:   PLANNED INTERVENTIONS: 97164- PT Re-evaluation, 97110-Therapeutic exercises, 97530- Therapeutic activity, 97112- Neuromuscular re-education, 97535- Self Care, 81191- Manual therapy, [id number]- Aquatic Therapy, 97014- Electrical stimulation (unattended), [id number]- Electrical stimulation (manual), 97035- Ultrasound, Patient/Family education, Taping, Dry Needling, Joint mobilization, Spinal mobilization, Cryotherapy, and Moist heat  PLAN FOR NEXT SESSION:  Pt to be discharged from skilled PT due to meeting all goals except partially meeting LTG #2.  She is independent with HEP and will cont with HEP.  Pt is agreeable with discharge.   PHYSICAL THERAPY DISCHARGE SUMMARY  Visits from Start of Care: 12  Current functional level related to goals / functional outcomes: See above   Remaining deficits: See above   Education / Equipment: See above    Audie Clear III PT, DPT 10/13/23 8:39 PM

## 2023-10-18 ENCOUNTER — Encounter (HOSPITAL_BASED_OUTPATIENT_CLINIC_OR_DEPARTMENT_OTHER): Payer: Federal, State, Local not specified - PPO | Admitting: Physical Therapy

## 2023-10-20 ENCOUNTER — Encounter (HOSPITAL_BASED_OUTPATIENT_CLINIC_OR_DEPARTMENT_OTHER): Payer: Federal, State, Local not specified - PPO | Admitting: Physical Therapy

## 2023-10-25 ENCOUNTER — Encounter (HOSPITAL_BASED_OUTPATIENT_CLINIC_OR_DEPARTMENT_OTHER): Payer: Federal, State, Local not specified - PPO | Admitting: Physical Therapy

## 2023-10-27 ENCOUNTER — Encounter (HOSPITAL_BASED_OUTPATIENT_CLINIC_OR_DEPARTMENT_OTHER): Payer: Federal, State, Local not specified - PPO | Admitting: Physical Therapy

## 2023-11-01 ENCOUNTER — Encounter (HOSPITAL_BASED_OUTPATIENT_CLINIC_OR_DEPARTMENT_OTHER): Payer: Federal, State, Local not specified - PPO | Admitting: Physical Therapy

## 2023-11-03 ENCOUNTER — Encounter (HOSPITAL_BASED_OUTPATIENT_CLINIC_OR_DEPARTMENT_OTHER): Payer: Federal, State, Local not specified - PPO | Admitting: Physical Therapy

## 2023-11-08 ENCOUNTER — Encounter (HOSPITAL_BASED_OUTPATIENT_CLINIC_OR_DEPARTMENT_OTHER): Payer: Federal, State, Local not specified - PPO | Admitting: Physical Therapy

## 2023-11-10 ENCOUNTER — Encounter (HOSPITAL_BASED_OUTPATIENT_CLINIC_OR_DEPARTMENT_OTHER): Payer: Federal, State, Local not specified - PPO | Admitting: Physical Therapy

## 2023-11-15 ENCOUNTER — Encounter (HOSPITAL_BASED_OUTPATIENT_CLINIC_OR_DEPARTMENT_OTHER): Payer: Federal, State, Local not specified - PPO | Admitting: Physical Therapy

## 2024-01-12 DIAGNOSIS — K080 Exfoliation of teeth due to systemic causes: Secondary | ICD-10-CM | POA: Diagnosis not present

## 2024-01-27 ENCOUNTER — Telehealth: Payer: Self-pay | Admitting: Internal Medicine

## 2024-01-27 DIAGNOSIS — R197 Diarrhea, unspecified: Secondary | ICD-10-CM

## 2024-01-27 NOTE — Telephone Encounter (Signed)
 Inbound call from patient requesting to speak with a nurse. States she has diarrhea.   Would like to have prednisone  prescription sent into [facility] on [address]   Please advise.   Last ov 08/2023

## 2024-01-27 NOTE — Telephone Encounter (Signed)
 Relayed provider message. Patient states she will try to get the stool sample as quick as possible.

## 2024-01-27 NOTE — Telephone Encounter (Signed)
 Spoke with patient. She is currently taking the Lialda  as ordered. She reports that she is only going 1x per day. She states she already has the kit at home for a fecal calprotectin as she was supposed to turn in a sample in May. She reports that she was unable to bring in sample because she was busy. Patient also reports that she notices she has flair ups when she gets stressed and she lost her mom in March and she believes that between this and illness 2 weeks ago, it has caused her to have a flair up. She states that she is willing to bring in the stool sample but would like to know if the provider can go ahead and order the Prednisone  so that she can have some relief. Will place order for C-Diff, calprotectin order is already in the computer.

## 2024-01-27 NOTE — Telephone Encounter (Signed)
 Pt has history of UC. Reports no flair up for 18 months. About 2 weeks ago she developed fever and aches and pains. She reports that she is having diarrhea since then. Denies abdominal pain and cramping. Also denies any blood in the stool. She reports that she goes usually 1x in the am and sometimes she will even skip a day and not have any stools. She has been eating a bland diet since this started. Pt states last time she had a flair Dr. Willy Harvest put her on Prednisone . She would like to know if she can a new rx for this.

## 2024-01-28 ENCOUNTER — Other Ambulatory Visit

## 2024-01-28 ENCOUNTER — Other Ambulatory Visit: Payer: Self-pay

## 2024-01-28 DIAGNOSIS — R197 Diarrhea, unspecified: Secondary | ICD-10-CM | POA: Diagnosis not present

## 2024-01-28 NOTE — Telephone Encounter (Signed)
 Inbound call from patient wanting she dropped off stool sample this morning. Requesting to know if she has to wait until results to be able to be prescribed medication for relief. Please advise, thank you

## 2024-01-28 NOTE — Telephone Encounter (Signed)
 Attempted to reach patient. No answer, left VM for patient to return call.

## 2024-02-01 ENCOUNTER — Other Ambulatory Visit: Payer: Self-pay

## 2024-02-01 DIAGNOSIS — K51919 Ulcerative colitis, unspecified with unspecified complications: Secondary | ICD-10-CM

## 2024-02-01 MED ORDER — PREDNISONE 10 MG PO TABS: ORAL_TABLET | ORAL | 0 refills | Status: AC

## 2024-02-01 NOTE — Telephone Encounter (Signed)
 I called [facility] to try and add on the stool sample. They will send it, but due to shelf life of specimen they are unsure if the pathologist will be able to use the same specimen. They will call us  back and let us  know if that is the case. Medication sent to pharmacy. Patient notified.

## 2024-02-09 DIAGNOSIS — L814 Other melanin hyperpigmentation: Secondary | ICD-10-CM | POA: Diagnosis not present

## 2024-02-09 DIAGNOSIS — L281 Prurigo nodularis: Secondary | ICD-10-CM | POA: Diagnosis not present

## 2024-02-09 DIAGNOSIS — D2262 Melanocytic nevi of left upper limb, including shoulder: Secondary | ICD-10-CM | POA: Diagnosis not present

## 2024-02-09 DIAGNOSIS — Z85828 Personal history of other malignant neoplasm of skin: Secondary | ICD-10-CM | POA: Diagnosis not present

## 2024-02-09 LAB — TEST AUTHORIZATION

## 2024-02-09 LAB — CALPROTECTIN: Calprotectin: 1980 ug/g — ABNORMAL HIGH

## 2024-02-09 LAB — CLOSTRIDIUM DIFFICILE TOXIN B, QUALITATIVE, REAL-TIME PCR: Toxigenic C. Difficile by PCR: NOT DETECTED

## 2024-02-11 ENCOUNTER — Ambulatory Visit: Payer: Self-pay | Admitting: Internal Medicine

## 2024-03-09 ENCOUNTER — Ambulatory Visit: Admitting: Internal Medicine

## 2024-03-09 ENCOUNTER — Other Ambulatory Visit (INDEPENDENT_AMBULATORY_CARE_PROVIDER_SITE_OTHER)

## 2024-03-09 ENCOUNTER — Encounter: Payer: Self-pay | Admitting: Internal Medicine

## 2024-03-09 VITALS — BP 122/74 | HR 93 | Wt 161.4 lb

## 2024-03-09 DIAGNOSIS — K51019 Ulcerative (chronic) pancolitis with unspecified complications: Secondary | ICD-10-CM

## 2024-03-09 NOTE — Progress Notes (Signed)
 Elizabeth Huerta 53 y.o. August 14, 1971 [id number]  Assessment & Plan:   Encounter Diagnosis  Name Primary?   Universal ulcerative colitis with complication (HCC) Yes    Symptomatic remission post-prednisone  with recent high calprotectin levels indicating inflammation. Mesalamine  4.8 g daily has not been controlling disease.  She will need escalation of therapy.  However she is contemplating moving very soon so we are not moving forward today but are going to do lab work as reflected below, and she is going to inquire about several different drugs that I listed in her AVS that are potential treatment options.  We did review risk benefit profiles of the drugs as best I could today understanding that most often these medications increase the risk of infections.  She had read that they might cause weight gain and that is not something that is typical in my understanding.  Also reviewed additional risks of need to monitor lipids and LFTs and some of these drugs.  If she does flare again relatively soon I do not think we need to repeat an infection workup we could just prescribe prednisone .  Uceris is not on her formulary.  I have encouraged her to get the Shingrix vaccine in case Rinvoq is used.  I think it is useful to have even if she is not going to take drugs that increase the risk of shingles.   Orders Placed This Encounter  Procedures   Calprotectin, Fecal   CBC with Differential/Platelet   C-reactive protein   Sedimentation rate   Hepatitis B surface antigen   Hepatitis B core antibody, total   Hepatitis B surface antibody,qualitative   QuantiFERON-TB Gold Plus   Drugs to ask insurance about are: Entyvio Zeposia Rinvoq Stelara Velspity   Subjective:  Gastroenterology summary:   2016-C. difficile colitis -  required 2 courses of vancomycin    July 2018-Universal ulcerative colitis diagnosed at colonoscopy Mesalamine  (Lialda ) 2.4 g daily begun after initial steroid taper    July 2022 colonoscopy-transverse and ascending endoscopic colitis biopsies with patchy mild chronic colitis, on mesalamine  2.4 g daily since April 2022 (prior labs and treatment) February 2024 started to flare, stressful family issues in the background May 2024 fecal calprotectin 226, normal sed rate and CRP mesalamine  increased to 4.8 g daily August 2024 colonoscopy endoscopic moderate colitis transverse to cecum biopsies with mild to moderate colitis in those areas plus descending and proximal sigmoid, distal sigmoid and rectum spared on mesalamine  4.8 g daily July 30, 2023 fecal calprotectin 1130 January 28, 2024 another flare with diarrhea, calprotectin 1980 C. difficile testing negative as was [employer] pathogen panel  Chief complaint: Follow-up of ulcerative colitis  Patient consented to the use of an AI scribe which was used to help obtain history today HPI 53 year old woman with Universal ulcerative colitis summarized as above, who presents in follow-up after she was treated with a prednisone  taper in June because of a flare of her ulcerative colitis.  She reports that she is now back to baseline except for some slight flatulence increase, stools are formed once or twice a day no abdominal pain or bleeding.  She thinks it is likely she will moved to [place]  within the next few months.  Because of that she does have some reluctance to start a new therapy now although she is not opposed.  She said she could make it back and forth for infusions if she needed to while she was waiting to establish care in [place]  should she move.  She has  had to deal with 2 parental deaths in the last 14 months which has been very stressful.  Mother was the most recent death and she may be moving into that house in [place]  and selling her house here.  She reports she has been eating healthier and cleaner and has lost some weight due to that she thinks.  Wt Readings from Last 3 Encounters:  03/09/24 161 lb  6 oz (73.2 kg)  08/26/23 163 lb (73.9 kg)  06/17/23 173 lb (78.5 kg)    Allergies  Allergen Reactions   Adhesive [Tape] Hives   Latex Rash   Current Meds  Medication Sig   augmented betamethasone dipropionate (DIPROLENE-AF) 0.05 % cream APPLY TOPICALLY TO THE AFFECTED AREA TWICE DAILY AS DIRECTED   Calcium Carb-Cholecalciferol (LIQUID CALCIUM WITH D3) 600-25 MG-MCG CAPS Take by mouth.   cetirizine (ZYRTEC) 10 MG tablet Take 10 mg by mouth at bedtime.    ciclopirox (LOPROX) 0.77 % cream APPLY TOPICALLY TO THE AFFECTED AREA TWICE DAILY   clobetasol (TEMOVATE) 0.05 % external solution    clotrimazole  (LOTRIMIN ) 1 % cream Apply 1 application topically 2 (two) times daily. Use for 2 weeks.   fluticasone  (FLONASE ) 50 MCG/ACT nasal spray Place 2 sprays into both nostrils daily.   indapamide (LOZOL) 2.5 MG tablet Take 1 tablet by mouth daily.   Melatonin 3 MG TABS Take 3 mg by mouth at bedtime.    mesalamine  (LIALDA ) 1.2 g EC tablet Take 2 tablets (2.4 g total) by mouth in the morning and at bedtime.   nystatin  (MYCOSTATIN /NYSTOP ) powder Apply topically 3 (three) times daily. Apply to affected area for up to 7 days   olopatadine  (PATANOL) 0.1 % ophthalmic solution Place 1 drop into both eyes 2 (two) times daily.   ondansetron  (ZOFRAN ) 4 MG tablet Take 1 tablet (4 mg total) by mouth every 8 (eight) hours as needed for nausea or vomiting.   saccharomyces boulardii (FLORASTOR) 250 MG capsule Take 250 mg by mouth 2 (two) times daily as needed for other. When on antibiotics   tretinoin (RETIN-A) 0.025 % cream SMARTSIG:1 sparingly Topical Every Night   Turmeric (QC TUMERIC COMPLEX PO) Take by mouth.   VITAMIN D  PO Take 5,000 Units by mouth daily.   Past Medical History:  Diagnosis Date   Allergy    Anxiety 2003   treated   Basal cell carcinoma    nose   C. difficile colitis 06/20/2015   COVID 01/2021   Eczema    History of basal cell carcinoma excision    2010-- nasal area   Hypertension     Hypokalemia    Low vitamin B12 level 2021   Low vitamin D  level    Nephrolithiasis    LEFT   Renal cyst, right    Right ureteral stone    Shingles    Universal ulcerative colitis (HCC) 02/25/2017   Past Surgical History:  Procedure Laterality Date   BUNIONECTOMY WITH TARSAL MEDITARSAL FUSION Right 09/28/2017   CESAREAN SECTION  2001  &  2003   COLONOSCOPY  02/2017   ulcerative colitis   CYSTOSCOPY/RETROGRADE/URETEROSCOPY Right 09/28/2014   Procedure: CYSTOSCOPY RIGHT RETROGRADE RIGHT URETEROSCOPY;  Surgeon: Ricardo Likens, MD;  Location: [facility];  Service: Urology;  Laterality: Right;   EXTRACORPOREAL SHOCK WAVE LITHOTRIPSY Bilateral left 05-15-2010/   right 07-03-2010   HOLMIUM LASER APPLICATION Right 09/28/2014   Procedure: HOLMIUM LASER OF STONES ;  Surgeon: Ricardo Likens, MD;  Location[facility];  Service: Urology;  Laterality: Right;   MOHS SURGERY  2010   nasal area   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 Avram   family history includes Alcohol abuse in her father, maternal grandfather, maternal grandmother, and mother; Asthma in her mother; COPD in her mother; Depression in her daughter, mother, and sister; Diabetes (age of onset: 83) in her father; Hearing loss in her father and mother; Heart attack (age of onset: 24) in her paternal grandfather; Heart attack (age of onset: 24) in her father; Heart disease in her father; Hyperlipidemia in her sister; Hypertension in her father and sister; Osteoarthritis in her father and mother; Skin cancer in her father and mother.   Review of Systems As per HPI  Objective:   Physical Exam @BP  122/74 (BP Location: Left Arm, Patient Position: Sitting, Cuff Size: Normal)   Pulse 93   Wt 161 lb 6 oz (73.2 kg)   BMI 27.70 kg/m @  General:  NAD Eyes:   anicteric Lungs:   clear Heart::  S1S2 no rubs, murmurs or gallops Abdomen:  soft and nontender, BS+ Ext:   no edema, cyanosis or clubbing    Data Reviewed:  See HPI and summary  I spent 41 minutes of time, including in depth chart review, independent review of results as outlined above, communicating results with the patient directly, face-to-face time with the patient, coordinating care, ordering studies and medications as appropriate, and documentation.

## 2024-03-09 NOTE — Patient Instructions (Addendum)
   Drugs to ask insurance about are: Entyvio Zeposia Rinvoq Stelara Velspity  Your provider has requested that you go to the basement level for lab work before leaving today. Press B on the elevator. The lab is located at the first door on the left as you exit the elevator.  Due to recent changes in healthcare laws, you may see the results of your imaging and laboratory studies on MyChart before your provider has had a chance to review them.  We understand that in some cases there may be results that are confusing or concerning to you. Not all laboratory results come back in the same time frame and the provider may be waiting for multiple results in order to interpret others.  Please give us  48 hours in order for your provider to thoroughly review all the results before contacting the office for clarification of your results.   Get the Shingrix vaccine at your pharmacy.   I appreciate the opportunity to care for you. Lupita Commander, MD, [facility]

## 2024-03-10 ENCOUNTER — Other Ambulatory Visit: Payer: Self-pay | Admitting: Internal Medicine

## 2024-03-10 DIAGNOSIS — K51919 Ulcerative colitis, unspecified with unspecified complications: Secondary | ICD-10-CM

## 2024-03-10 DIAGNOSIS — K51018 Ulcerative (chronic) pancolitis with other complication: Secondary | ICD-10-CM

## 2024-03-10 LAB — C-REACTIVE PROTEIN: CRP: <1 mg/dL (ref 0.5–20.0)

## 2024-03-10 LAB — CBC WITH DIFFERENTIAL/PLATELET
Basophils Absolute: 0.1 K/uL (ref 0.0–0.1)
Basophils Relative: 0.9 % (ref 0.0–3.0)
Eosinophils Absolute: 0.1 K/uL (ref 0.0–0.7)
Eosinophils Relative: 1.8 % (ref 0.0–5.0)
HCT: 40.9 % (ref 36.0–46.0)
Hemoglobin: 13.7 g/dL (ref 12.0–15.0)
Lymphocytes Relative: 28.6 % (ref 12.0–46.0)
Lymphs Abs: 1.7 K/uL (ref 0.7–4.0)
MCHC: 33.5 g/dL (ref 30.0–36.0)
MCV: 85.2 fl (ref 78.0–100.0)
Monocytes Absolute: 0.5 K/uL (ref 0.1–1.0)
Monocytes Relative: 8.7 % (ref 3.0–12.0)
Neutro Abs: 3.5 K/uL (ref 1.4–7.7)
Neutrophils Relative %: 60 % (ref 43.0–77.0)
Platelets: 325 K/uL (ref 150.0–400.0)
RBC: 4.8 Mil/uL (ref 3.87–5.11)
RDW: 13.2 % (ref 11.5–15.5)
WBC: 5.9 K/uL (ref 4.0–10.5)

## 2024-03-10 LAB — SEDIMENTATION RATE: Sed Rate: 24 mm/h (ref 0–30)

## 2024-03-13 LAB — HEPATITIS B CORE ANTIBODY, TOTAL: Hep B Core Total Ab: NONREACTIVE

## 2024-03-13 LAB — QUANTIFERON-TB GOLD PLUS
Mitogen-NIL: 6.81 [IU]/mL
NIL: 0.04 [IU]/mL
QuantiFERON-TB Gold Plus: NEGATIVE
TB1-NIL: <0 [IU]/mL
TB2-NIL: <0 [IU]/mL

## 2024-03-13 LAB — HEPATITIS B SURFACE ANTIBODY,QUALITATIVE: Hep B S Ab: NONREACTIVE

## 2024-03-13 LAB — HEPATITIS B SURFACE ANTIGEN: Hepatitis B Surface Ag: NONREACTIVE

## 2024-03-14 ENCOUNTER — Ambulatory Visit: Payer: Self-pay | Admitting: Internal Medicine

## 2024-04-03 ENCOUNTER — Other Ambulatory Visit

## 2024-04-03 DIAGNOSIS — K51019 Ulcerative (chronic) pancolitis with unspecified complications: Secondary | ICD-10-CM

## 2024-04-05 LAB — CALPROTECTIN, FECAL: Calprotectin, Fecal: 314 ug/g — ABNORMAL HIGH (ref 0–120)

## 2024-06-27 ENCOUNTER — Encounter: Payer: Self-pay | Admitting: Obstetrics and Gynecology
